# Patient Record
Sex: Female | Born: 1965 | Race: White | Hispanic: No | Smoking: Never smoker
Health system: Southern US, Community
[De-identification: ages and names within clinical notes are randomized; demographics above are authoritative.]

## PROBLEM LIST (undated history)

## (undated) DIAGNOSIS — M329 Systemic lupus erythematosus, unspecified: Secondary | ICD-10-CM

## (undated) DIAGNOSIS — Z8674 Personal history of sudden cardiac arrest: Secondary | ICD-10-CM

## (undated) DIAGNOSIS — I82409 Acute embolism and thrombosis of unspecified deep veins of unspecified lower extremity: Secondary | ICD-10-CM

---

## 1997-11-16 ENCOUNTER — Emergency Department (HOSPITAL_COMMUNITY): Admission: EM | Admit: 1997-11-16 | Discharge: 1997-11-16 | Payer: Self-pay | Admitting: Emergency Medicine

## 1998-04-22 ENCOUNTER — Ambulatory Visit (HOSPITAL_COMMUNITY): Admission: RE | Admit: 1998-04-22 | Discharge: 1998-04-22 | Payer: Self-pay | Admitting: Family Medicine

## 1998-04-23 ENCOUNTER — Inpatient Hospital Stay (HOSPITAL_COMMUNITY): Admission: AD | Admit: 1998-04-23 | Discharge: 1998-04-23 | Payer: Self-pay | Admitting: Rheumatology

## 1998-05-14 ENCOUNTER — Ambulatory Visit (HOSPITAL_COMMUNITY): Admission: RE | Admit: 1998-05-14 | Discharge: 1998-05-14 | Payer: Self-pay | Admitting: Rheumatology

## 1998-07-21 ENCOUNTER — Other Ambulatory Visit: Admission: RE | Admit: 1998-07-21 | Discharge: 1998-07-21 | Payer: Self-pay | Admitting: Obstetrics and Gynecology

## 1998-08-30 ENCOUNTER — Emergency Department (HOSPITAL_COMMUNITY): Admission: EM | Admit: 1998-08-30 | Discharge: 1998-08-30 | Payer: Self-pay | Admitting: Emergency Medicine

## 1999-08-12 ENCOUNTER — Other Ambulatory Visit: Admission: RE | Admit: 1999-08-12 | Discharge: 1999-08-12 | Payer: Self-pay | Admitting: Obstetrics and Gynecology

## 1999-08-27 ENCOUNTER — Ambulatory Visit (HOSPITAL_COMMUNITY): Admission: RE | Admit: 1999-08-27 | Discharge: 1999-08-27 | Payer: Self-pay | Admitting: Obstetrics and Gynecology

## 1999-08-27 ENCOUNTER — Encounter: Payer: Self-pay | Admitting: Obstetrics and Gynecology

## 2000-08-15 ENCOUNTER — Other Ambulatory Visit: Admission: RE | Admit: 2000-08-15 | Discharge: 2000-08-15 | Payer: Self-pay | Admitting: Obstetrics and Gynecology

## 2001-08-29 ENCOUNTER — Other Ambulatory Visit: Admission: RE | Admit: 2001-08-29 | Discharge: 2001-08-29 | Payer: Self-pay | Admitting: Obstetrics and Gynecology

## 2002-08-09 ENCOUNTER — Encounter: Payer: Self-pay | Admitting: Rheumatology

## 2002-08-09 ENCOUNTER — Encounter: Admission: RE | Admit: 2002-08-09 | Discharge: 2002-08-09 | Payer: Self-pay | Admitting: Rheumatology

## 2002-12-17 ENCOUNTER — Other Ambulatory Visit: Admission: RE | Admit: 2002-12-17 | Discharge: 2002-12-17 | Payer: Self-pay | Admitting: Obstetrics and Gynecology

## 2004-09-23 ENCOUNTER — Encounter: Admission: RE | Admit: 2004-09-23 | Discharge: 2004-09-23 | Payer: Self-pay | Admitting: Obstetrics and Gynecology

## 2005-02-21 ENCOUNTER — Ambulatory Visit (HOSPITAL_COMMUNITY): Admission: RE | Admit: 2005-02-21 | Discharge: 2005-02-22 | Payer: Self-pay | Admitting: Neurosurgery

## 2005-03-12 ENCOUNTER — Observation Stay (HOSPITAL_COMMUNITY): Admission: EM | Admit: 2005-03-12 | Discharge: 2005-03-13 | Payer: Self-pay | Admitting: Emergency Medicine

## 2005-03-15 ENCOUNTER — Inpatient Hospital Stay (HOSPITAL_COMMUNITY): Admission: EM | Admit: 2005-03-15 | Discharge: 2005-03-17 | Payer: Self-pay | Admitting: Emergency Medicine

## 2005-09-10 ENCOUNTER — Emergency Department (HOSPITAL_COMMUNITY): Admission: EM | Admit: 2005-09-10 | Discharge: 2005-09-10 | Payer: Self-pay | Admitting: Emergency Medicine

## 2005-10-27 ENCOUNTER — Other Ambulatory Visit: Admission: RE | Admit: 2005-10-27 | Discharge: 2005-10-27 | Payer: Self-pay | Admitting: Gynecology

## 2006-12-04 ENCOUNTER — Ambulatory Visit (HOSPITAL_COMMUNITY): Admission: RE | Admit: 2006-12-04 | Discharge: 2006-12-04 | Payer: Self-pay | Admitting: General Surgery

## 2006-12-04 ENCOUNTER — Encounter (INDEPENDENT_AMBULATORY_CARE_PROVIDER_SITE_OTHER): Payer: Self-pay | Admitting: General Surgery

## 2009-04-06 ENCOUNTER — Encounter: Admission: RE | Admit: 2009-04-06 | Discharge: 2009-04-06 | Payer: Self-pay | Admitting: Gynecology

## 2010-05-08 ENCOUNTER — Encounter: Payer: Self-pay | Admitting: Rheumatology

## 2010-08-31 NOTE — Op Note (Signed)
Carla Mack, Carla Mack               ACCOUNT NO.:  [id number]   MEDICAL RECORD NO.:  [id number]          PATIENT TYPE:  AMB   LOCATION:  DAY                          FACILITY:  [facility]   PHYSICIAN:  Timothy E. Earlene Plater, M.D. DATE OF BIRTH:  August 06, 1965   DATE OF PROCEDURE:  12/04/2006  DATE OF DISCHARGE:                               OPERATIVE REPORT   PREOPERATIVE DIAGNOSIS:  External hemorrhoids.   POSTOPERATIVE DIAGNOSIS:  External hemorrhoids.   PROCEDURE:  Hemorrhoidectomy.   SURGEON:  Timothy E. Earlene Plater, M.D.   ANESTHESIA:  General.   Ms. Carla Mack is 67.  Has long had external hemorrhoidal tags causing  considerable irritation, and she wishes, after very careful discussion  regarding the surgery, the expectations of the recovery, to proceed with  excisional surgery.  Again this morning, we discussed in detail the  procedure and the recovery.   She was taken to the operating room and placed supine.  LMA anesthesia  provided.  She was placed in lithotomy.  Careful inspection with  OptiVisor magnification revealed a peculiar change on the skin surface  with many tiny nodules particularly posteriorly.  These did not have the  exact appearance of condylomata but were not typical hemorrhoids.  These  were 1 or 1.5 mm.  The skin was abraded at the anal orifice.  There were  4 large external tags and several minor external tags as well.  So, the  perianal area was injected round about with Marcaine, epinephrine, and  Wydase.  This was massaged as well.  The 4 large tags were removed  first, the edges smoothed, and the wounds closed with 3-0 and 4-0  chromic.  Then the other 4 minor tags were excised.  Some undermining  was accomplished.  Therefore, I had 4 large tags, 4 small tags, and 3  hemorrhoidal varices.  I counted these 11 and placed them in formalin  myself for pathology.  I think special attention to the squamous surface  is in order.  So each wound was carefully closed.  The tiny  nodular  lesions posteriorly were cauterized only.  The orifice was well-open,  the sphincter was unharmed, and the procedure was complete.  Gelfoam and  dry sterile dressing applied.   She tolerated it, counts correct, and she was removed to the recovery  room in good condition.  Written and verbal instructions given including  Vicodin.  She will be seen and followed as an outpatient.      Timothy E. Earlene Plater, M.D.  Electronically Signed     TED/MEDQ  D:  12/04/2006  T:  12/04/2006  Job:  [id number]   cc:   Gretta Cool, M.D.  Fax: [phone]   Demetria Pore. Coral Spikes, M.D.  Fax: [phone]

## 2010-09-03 NOTE — H&P (Signed)
Carla Mack, HINNENKAMP               ACCOUNT NO.:  [id number]   MEDICAL RECORD NO.:  [id number]          PATIENT TYPE:  EMS   LOCATION:  MAJO                         FACILITY:  [facility]   PHYSICIAN:  Jackie Plum, M.D.DATE OF BIRTH:  01/06/1966   DATE OF ADMISSION:  03/15/2005  DATE OF DISCHARGE:                                HISTORY & PHYSICAL   CHIEF COMPLAINT:  Chest pain.   HISTORY OF PRESENT ILLNESS:  The patient presented with chest pain this  morning.  She had been operated on for back surgery last week and had been  discharged  from the hospital after readmission for DVT.  She came to the ED  for chest pain, which was said to be sharp, left-sided, radiating to her  back.  Pain was also noted to be severe with a scale of 7-8/10.  She has had  some shortness of breath with the pain, and the pain is worsened by deep  breathing.  At the emergency room the patient had a CT scan of the chest to  rule out PE on account of chest pain with recent diagnosis of DVT (the  patient had been taking Lovenox and Coumadin at home).  The CT scan was said  to be negative, and it showed just atelectasis.  I do not have official  report.  This is per ED physicians.  Whilst in the ED, the patient was  having a lot of pain and she was given morphine, and she started having some  difficulty breathing.  She became diaphoretic with the feeling of her throat  closing up and swelling.  She was treated emergently for presumed  anaphylactic reaction with Pepcid, Phenergan and epinephrine with  significant improvement.  The hospitalist service was called to evaluate for  admission for observation ___________.  At the time of my evaluation, the  patient's chest pain had resolved.  She was feeling fine and did not have  any shortness of breath, dizziness or any other pulmonary symptoms.  She,  however, continued to have pain on deep breathing.   PAST MEDICAL HISTORY:  1.  Positive for history of systemic  lupus erythematosus.  2.  She has a history of bilateral DVT and PE in 1990.  3.  She has history of arthritis and rash with her lupus.  4.  She is status post lumbar diskectomy and cesarean section as noted      above.   The patient's medicines are aspirin, p.r.n. ibuprofen, as well as Lovenox  and Coumadin.   Social history and family history are as noted per H&P by Hal T. Pete Glatter,  M.D., dated March 12, 2005.  __________ is reviewed.   REVIEW OF SYSTEMS:  As noted in the HPI, otherwise unremarkable.   PHYSICAL EXAMINATION:  VITAL SIGNS:  Temperature 97.4 degrees Fahrenheit, BP  98/56, pulse 53, respirations 16, O2 saturation 100% on oxygen by nasal  cannula.  GENERAL: The patient was comfortable, not in distress.  HEENT:  Pupils equal, reactive to light.  Extraocular movements intact.  Oropharynx moist.  NECK:  Supple, no JVD.  LUNGS:  Clear to auscultation.  CARDIAC:  Regular rate and rhythm, no gallops or murmur.  ABDOMEN:  Soft, nontender.  EXTREMITIES:  No cyanosis.  CENTRAL NERVOUS SYSTEM:  Nonfocal.  CHEST:  The patient did not have any chest wall tenderness by palpation.   LABORATORY DATA:  CT scan of her chest as noted above.  ABG was noted.  WBC  count 3.9, hemoglobin 14.3, hematocrit 42, platelet count 176.  Sodium 138,  potassium 3.1, chloride 107, glucose 156, BUN 22, creatinine 1.1.   IMPRESSION:  1.  Chest pain.  The patient indicates this has been ___________      postoperatively.  This involved the use of her upper extremities as the      patient her arms, and this likely represents musculoskeletal pain.  2.  Anaphylactic reaction to MORPHINE.   The patient will be admitted for observation overnight.  She will be started  on two days of both H1 and A2 blockers and she is said to have had  ventricular tachycardia in the ED, and we will just obtain a 2-D  echocardiogram for completion of her workup.  I do not think she needs any  stress test or any  further cardiac workup at this point.  The patient does  not have any history of diabetes, hypertension, dyslipidemia.  Her family  history does not include any history of diabetes, hypertension, stroke or  heart disease.  Her father died from bowel cancer, and her mother died from  cervical cancer.      Jackie Plum, M.D.  Electronically Signed     GO/MEDQ  D:  03/15/2005  T:  03/15/2005  Job:  [id number]

## 2010-09-03 NOTE — Op Note (Signed)
Carla Mack, Carla Mack               ACCOUNT NO.:  [id number]   MEDICAL RECORD NO.:  [id number]          PATIENT TYPE:  AMB   LOCATION:  SDS                          FACILITY:  [facility]   PHYSICIAN:  Henry A. Pool, M.D.    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   OPERATION PERFORMED:  Right L3-4 laminotomy and microdiskectomy.   SURGEON:  Kathaleen Maser. Pool, M.D.   ASSISTANT:  Reinaldo Meeker, M.D.   ANESTHESIA:  General endotracheal.   INDICATIONS FOR PROCEDURE:  Ms. 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  The patient has had severe right lower extremity radicular pain  consistent with a right-sided, L3 versus L4 radiculopathy.  The patient has  failed conservative management.  She has been so miserable with pain that  she has been unable to be imaged adequately because she cannot hold still  for a scan.  She presents to the hospital today for MRI scanning under hoped  conscious sedation followed by probable lumbar microdiskectomy.  The patient  was unable to hold still for the MRI scan even with high doses of conscious  sedation. The patient was then converted to a general anesthetic in order to  obtain the MRI scan.  MRI scanning did confirm the right-sided L3-4 disk  herniation with compression of the right-sided L3 nerve root from a superior  free fragment.  This is what the initial working diagnosis had been and the  patient had already signed a consent for the planned surgery.  I discussed  the situation with her husband and he decided to proceed directly back to  the operating room for microdiskectomy in hopes of improving her  symptoms.   DESCRIPTION OF PROCEDURE:  The patient was taken to the operating room and  placed on the table in the supine position.  After adequate level of  anesthesia was achieved, the patient was positioned prone onto a Wilson  frame and appropriately padded.  The patient's lumbar region was prepped and  draped sterilely.  A 10 blade was used to make a linear skin incision  overlying the L3-4 interspace.  This was carried down sharply in the  midline.  A subperiosteal dissection was then performed exposing the lamina  and facet joints of L3 and L4 on the right side.  Deep self-retaining  retractor was placed.  Intraoperative x-ray was taken, the level was  confirmed.  The laminotomy was then performed using high speed drill and  Kerrison rongeurs to remove the inferior edge of the lamina at L3, medial  aspect of the L3-4 facet joint and superior rim of the L4 lamina.  The  ligamentum flavum was then elevated and resected in piecemeal fashion using  Kerrison rongeurs.  The underlying thecal sac and  exiting L4 nerve root were  identified.   The microscope was brought into the field and used for microdissection of  the spinal canal and disk herniation.  Epidural venous plexus was coagulated  and cut.  A superior free fragment of disk herniation was encountered in the  axilla of the right-sided L3 nerve root.  This was dissected free and  removed using pituitary microrongeurs.  This completely decompressed the  left-sided L3 nerve root which then came into view.  The canal was inspected  for further disk herniation and none was found. The disk space itself was  quite flat.  The annulus was essentially intact.  The disk space was  palpated and found no obvious major rent worrisome for reherniation.  For  that reason an annulotomy and further diskectomy was not performed.  The  wound was then irrigated with antibiotic solution.  Gelfoam was placed  topically for hemostasis which was found  to be good.  Microscope and  retractor system were removed.  Hemostasis in muscle was achieved with  electrocautery.  The wound was then closed in layers with Vicryl sutures.  Steri-Strips and sterile dressing were applied.  There were no apparent  complications.  The patient tolerated the procedure well and returned to the  recovery room postoperatively.           ______________________________  Kathaleen Maser Pool, M.D.     HAP/MEDQ  D:  02/21/2005  T:  02/22/2005  Job:  [id number]

## 2010-09-03 NOTE — Discharge Summary (Signed)
Carla Mack, Carla Mack               ACCOUNT NO.:  [id number]   MEDICAL RECORD NO.:  [id number]          PATIENT TYPE:  INP   LOCATION:  3706                         FACILITY:  [facility]   PHYSICIAN:  Sherin Quarry, MD      DATE OF BIRTH:  16-Nov-1965   DATE OF ADMISSION:  03/15/2005  DATE OF DISCHARGE:  03/17/2005                                 DISCHARGE SUMMARY   Carla Mack is a 45 year old lady who recently was discharged from the  hospital after being and diagnosed with a deep vein thrombophlebitis which  occurred following a lumbar diskectomy.  She was discharged on a regimen of  Lovenox 80 mg q.12h. and Coumadin with the plan that she would remain on the  Lovenox until her INR became therapeutic.  On March 15, 2005, she  presented to the [facility] Emergency Room with  complaints of chest discomfort.  It was described as sharp, left-sided,  radiating through the back and being about 7 to 8/10.  It was associated  with a feeling of shortness of breath.  In light of her history, a CT scan  of the chest was performed which showed no signs of a pulmonary embolus.  While in the emergency room, the patient was administered morphine and  shortly after receiving the intravenous morphine, she became diaphoretic and  experienced a feeling of throat swelling and generalized itching.  Concern  was raised that she might be having anaphylactic reaction to the morphine.  She was given the Pepcid, epinephrine and Phenergan and her overall  condition improved.  However, it was felt prudent to admit her to the  hospital for monitoring in light of this reaction.   Physical exam at time of admission as described by Dr. Julio Sicks,  the  patient was comfortable and not any distress of the time that Dr. Julio Sicks  saw her. HEENT exam was within normal limits. The chest was clear.  Cardiovascular exam revealed normal S1 and S2 without rubs, murmurs or  gallops. The abdomen was  benign. Neurologic testing showed no abnormalities  of  motor, sensory or cerebellar system.  Examination of extremities showed  no signs of cyanosis or edema.   Relevant laboratory studies obtained included a prothrombin time which  showed an INR 1.1.  Hemoglobin was 11.8.  The potassium level was initially  3.1.  In light of the patient's surprisingly low INR, she was maintained on  Lovenox 80 mg subcutaneously every 12 hours and was placed on Coumadin 7.5  mg daily.  By March 17, 2005, her INR was up to 1.5.  At that point her  potassium was up to 4.4 after she received oral potassium replacement. She  was feeling well at that time and plan was made for discharge.   DISCHARGE DIAGNOSES:  1.  Transient chest pain not secondary to pulmonary embolus consider      gastrointestinal cause.  2.  Anaphylactic reaction to morphine.  3.  Deep vein thrombophlebitis.  4.  History of systemic lupus erythematosus.  5.  Status post recent lumbar diskectomy.  DISCHARGE MEDICATIONS:  1.  Coumadin. The patient will be advised to take 7.5 mg of Coumadin      Thursday and Friday, 5 mg Saturday and Sunday and then have her      prothrombin time rechecked in the office on Monday. Apparently she is      going to do this in Dr. Yevonne Pax office.  2.  She is advised to continue Lovenox 80 mg subcutaneously every 2 hours      until she receives further instructions.  3.  Because she is having indigestion symptoms, she is advised to stop      ibuprofen.  4.  She has some Vicodin at home which she can take p.r.n. for pain.  5.  She was also advised to take Pepcid AC one tablet b.i.d.   I emphasized to her that it was extremely important that she have her  prothrombin time measured on Monday and then receive further instructions at  that time.  She indicated that she did not have any Coumadin at home and I  therefore wrote her a prescription for this.   CONDITION AT TIME OF DISCHARGE:  Good.            ______________________________  Sherin Quarry, MD     SY/MEDQ  D:  03/17/2005  T:  03/17/2005  Job:  [id number]   cc:   Donia Guiles, M.D.  Fax: [phone]   Demetria Pore. Coral Spikes, M.D.  Fax: [phone]   Cassell Clement, M.D.  Fax: [phone]

## 2010-09-03 NOTE — Discharge Summary (Signed)
Carla Mack, Carla Mack               ACCOUNT NO.:  [id number]   MEDICAL RECORD NO.:  [id number]          PATIENT TYPE:  INP   LOCATION:  1521                         FACILITY:  [facility]   PHYSICIAN:  Candyce Churn, M.D.DATE OF BIRTH:  19-Jun-1965   DATE OF ADMISSION:  03/12/2005  DATE OF DISCHARGE:  03/13/2005                                 DISCHARGE SUMMARY   DISCHARGE DIAGNOSES:  1.  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 Left lower extremity venous Doppler ultrasound pending this      a.m.  2.  Systemic lupus erythematous with arthritis - followed by Dr. Lennox Pippins.   HOSPITAL COURSE:  The patient was admitted on March 12, 2005 with left  leg swelling for 2 days. She had had a lumbar diskectomy Dr. Julio Sicks  approximately 3 weeks ago. She has a left calf that is painful when she  walks and on examination by Dr. __________ on March 12, 2005 her left  calf was swollen with tenderness posteriorly.   Her PTT is elevated at 58 suggesting possible lupus anticoagulant.   The patient was started on subcu Lovenox and her leg feels better today. She  has had no chest pain or shortness of breath.   Her calculated dose of Lovenox is 80 mg subcu q.12 hours for DVT and she  will get a dose at 11 a.m. and pending results of venous Doppler discharge  will be likely early this afternoon.   If DVT is confirmed, she will be discharged on the following:  1.  Lovenox 80 mg subcu q.12 h.  2.  Coumadin 10 mg p.o. x1 today and then 5 mg daily with followup PT/INR on      March 15, 2005.   She normally takes an aspirin daily and we will keep her off aspirin for now  and defer decision on aspirin therapy to Dr. Lennox Pippins.   DISCHARGE LABORATORIES:  From March 12, 2005, white count 5200,  hemoglobin 12.2, platelet count 136,000, normal differential. PT is 14.1  seconds, PTT is 58 seconds - elevated. Sodium 138, potassium  4.4, chloride  100, bicarb 28, glucose 96, BUN 20, creatinine 1.2, LFTs within normal  limits except for a slightly low albumin at 3.3. Calcium 9.2.      Candyce Churn, M.D.  Electronically Signed     RNG/MEDQ  D:  03/13/2005  T:  03/14/2005  Job:  [id number]   cc:   Demetria Pore. Coral Spikes, M.D.  Fax: [phone]   Kathaleen Maser. Pool, M.D.  Fax: [phone]

## 2010-09-03 NOTE — H&P (Signed)
[name], Mack               ACCOUNT NO.:  [id number]   MEDICAL RECORD NO.:  [id number]          PATIENT TYPE:  EMS   LOCATION:  ED                           FACILITY:  [facility]   PHYSICIAN:  Hal T. Stoneking, M.D. DATE OF BIRTH:  October 06, 1965   DATE OF ADMISSION:  03/12/2005  DATE OF DISCHARGE:                                HISTORY & PHYSICAL   IDENTIFYING DATA:  Carla Mack is a very nice, 45 year old white female,  generally in good health, followed by Dr. Coral Spikes. She does have a  background history of bilateral DVT and pulmonary embolus in 1990 when she  gave birth by C-section to her daughter. 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 She developed a lumbar disk, is status post a lumbar  diskectomy 3 weeks ago by Dr. Jordan Likes. She started physical therapy about a  week ago. Two days ago, while walking, she noted pain in her left calf. She  noted that her left calf was swollen and would not go down, so she came to  the emergency room for evaluation. She denied any cough or chest pain.   ALLERGIES:  No known drug allergies.   CURRENT MEDICATIONS:  1.  Aspirin 81 mg a day.  2.  She takes p.r.n. Pamprin during her periods.  3.  Ibuprofen p.r.n.   PAST MEDICAL HISTORY:  1.  Remarkable for bilateral DVT and pulmonary embolus in 1990.  2.  History of lupus primarily with arthritis, although she has had a rash      in the past.   PAST SURGICAL HISTORY:  1.  She is status post C-section.  2.  Status post lumbar diskectomy.   SOCIAL HISTORY:  She is married and has one daughter of her own, age 47, and  also a step-daughter. She is a Theatre stage manager. She stopped smoking in 1990.  Does not consume alcohol.   FAMILY HISTORY:  Mother died at age 67 of cervical cancer. Father died at  age 62 of colon cancer. She has a sister with  diabetes.   REVIEW OF SYSTEMS:  No change in her vision. She does complain of a dull  headache. No trouble chewing or swallowing, although she has complained of  some heartburn. No abdominal pain. No urinary or bowel complaints.   PHYSICAL EXAMINATION:  VITAL SIGNS:  Temperature 97.2, blood pressure  126/83, pulse 89, O2 saturation 99.  SKIN:  Normal.  HEENT:  Pupils are equal, round, and reactive to light. Disks sharp.  Vasculature normal. Tympanic membranes normal. Oropharynx moist.  LUNGS:  Clear.  HEART:  Regular rate and rhythm. Without murmurs, rubs, or gallops.  ABDOMEN:  Soft. No hepatosplenomegaly or masses palpated.  EXTREMITIES:  Examination revealed positive Denna Haggard' sign on the left. Left  calf is definitely swollen and tender posteriorly.   LABORATORY DATA:  She had a CBC that revealed a white count of 5200,  hemoglobin 12.2, platelet count 136,000 with 68% neutrophils, 22% lymphs, 6%  monocytes, 4% eosinophils. Sodium 138, potassium 4.4, chloride 100, bicarb  28, glucose 96, BUN 20, creatinine 1.2, calcium 9.2, total protein 6.9,  albumin 3.3, SGOT 17, SGPT 11, alkaline phosphatase 58, total bilirubin 0.7.   ASSESSMENT:  High risk for deep vein thrombosis with her prior history of  DVT and pulmonary embolus. Especially with her recent back surgery and  current findings on physical examination.   PLAN:  Will admit. Will begin subcutaneous Lovenox. Will obtain Doppler  study tomorrow. There is a possibility that she might be able to go home  with home Lovenox and Coumadin, depending on insurance coverage.           ______________________________  Sunday Spillers Pete Glatter, M.D.     HTS/MEDQ  D:  03/12/2005  T:  03/12/2005  Job:  [id number]   cc:   Demetria Pore. Coral Spikes, M.D.  Fax: [phone]

## 2010-09-22 ENCOUNTER — Other Ambulatory Visit: Payer: Self-pay | Admitting: Gynecology

## 2011-01-28 LAB — COMPREHENSIVE METABOLIC PANEL
ALT: 21
AST: 27
Albumin: 3.6
Alkaline Phosphatase: 41
BUN: 25 — ABNORMAL HIGH
CO2: 29
Calcium: 9.1
Chloride: 106
Creatinine, Ser: 1.64 — ABNORMAL HIGH
GFR calc Af Amer: 42 — ABNORMAL LOW
GFR calc non Af Amer: 34 — ABNORMAL LOW
Glucose, Bld: 99
Potassium: 4.5
Sodium: 142
Total Bilirubin: 0.9
Total Protein: 6.7

## 2011-01-28 LAB — CBC
HCT: 39.7
Hemoglobin: 14.1
MCHC: 35.5
MCV: 88.1
Platelets: 189
RBC: 4.51
RDW: 13.2
WBC: 3.2 — ABNORMAL LOW

## 2011-01-28 LAB — DIFFERENTIAL
Basophils Absolute: 0
Basophils Relative: 1
Eosinophils Absolute: 0
Eosinophils Relative: 2
Lymphocytes Relative: 33
Lymphs Abs: 1
Monocytes Absolute: 0.3
Monocytes Relative: 9
Neutro Abs: 1.8
Neutrophils Relative %: 57

## 2011-01-28 LAB — PREGNANCY, URINE: Preg Test, Ur: NEGATIVE

## 2011-07-07 ENCOUNTER — Encounter: Payer: Self-pay | Admitting: *Deleted

## 2012-02-10 ENCOUNTER — Emergency Department (HOSPITAL_COMMUNITY): Payer: Worker's Compensation

## 2012-02-10 ENCOUNTER — Emergency Department (HOSPITAL_COMMUNITY)
Admission: EM | Admit: 2012-02-10 | Discharge: 2012-02-10 | Disposition: A | Discharge status: Home / Self Care | Payer: Worker's Compensation | Attending: Emergency Medicine | Admitting: Emergency Medicine

## 2012-02-10 ENCOUNTER — Encounter (HOSPITAL_COMMUNITY): Payer: Self-pay | Admitting: Adult Health

## 2012-02-10 DIAGNOSIS — Z8674 Personal history of sudden cardiac arrest: Secondary | ICD-10-CM | POA: Insufficient documentation

## 2012-02-10 DIAGNOSIS — Y990 Civilian activity done for income or pay: Secondary | ICD-10-CM | POA: Insufficient documentation

## 2012-02-10 DIAGNOSIS — T148XXA Other injury of unspecified body region, initial encounter: Secondary | ICD-10-CM

## 2012-02-10 DIAGNOSIS — W1809XA Striking against other object with subsequent fall, initial encounter: Secondary | ICD-10-CM | POA: Insufficient documentation

## 2012-02-10 DIAGNOSIS — S20229A Contusion of unspecified back wall of thorax, initial encounter: Secondary | ICD-10-CM

## 2012-02-10 DIAGNOSIS — Y929 Unspecified place or not applicable: Secondary | ICD-10-CM | POA: Insufficient documentation

## 2012-02-10 DIAGNOSIS — I82409 Acute embolism and thrombosis of unspecified deep veins of unspecified lower extremity: Secondary | ICD-10-CM | POA: Insufficient documentation

## 2012-02-10 DIAGNOSIS — M329 Systemic lupus erythematosus, unspecified: Secondary | ICD-10-CM | POA: Insufficient documentation

## 2012-02-10 DIAGNOSIS — IMO0002 Reserved for concepts with insufficient information to code with codable children: Secondary | ICD-10-CM | POA: Insufficient documentation

## 2012-02-10 HISTORY — DX: Systemic lupus erythematosus, unspecified: M32.9

## 2012-02-10 HISTORY — DX: Personal history of sudden cardiac arrest: Z86.74

## 2012-02-10 HISTORY — DX: Acute embolism and thrombosis of unspecified deep veins of unspecified lower extremity: I82.409

## 2012-02-10 MED ORDER — CYCLOBENZAPRINE HCL 10 MG PO TABS: 10.0000 mg | ORAL_TABLET | Freq: Two times a day (BID) | ORAL | Status: AC | PRN

## 2012-02-10 MED ORDER — IBUPROFEN 800 MG PO TABS: 800.0000 mg | ORAL_TABLET | Freq: Three times a day (TID) | ORAL | Status: AC

## 2012-02-10 NOTE — ED Notes (Signed)
While at work slipped and fell on pile of debris and landed on airpack on back. Pain is located in lumbar and mid thoracic are. Pt took aleve and ibuprofen with no relief. Pain rating 7/10. Denies numbness and tingling to toes and legs. Able to ambulate. Pain worse with movement.

## 2012-02-10 NOTE — ED Provider Notes (Signed)
History     CSN: [id number]  Arrival date & time 02/10/12  2056   First MD Initiated Contact with Patient 02/10/12 2146      Chief Complaint  Patient presents with  . Back Pain    (Consider location/radiation/quality/duration/timing/severity/associated sxs/prior treatment) HPI  Patient presents to the emergency department for evaluation of her back. 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 Air pack weighs 70 pounds. She says that she is able to walk and has been taking Aleve and ibuprofen with mild relief. She denies have any lacerations or bruising. The incident happened earlier today. She denies any numbness or tingling in her toes her legs. She is ambulatory but her pain gets much worse with movement. She denies loss of bowel or urine control. She is in no acute distress at this time and her vital signs are stable.  Past Medical History  Diagnosis Date  . Lupus   . DVT (deep venous thrombosis)   . History of cardiac arrest     Past Surgical History  Procedure Date  . Cesarean section     History reviewed. No pertinent family history.  History  Substance Use Topics  . Smoking status: Never Smoker   . Smokeless tobacco: Not on file  . Alcohol Use: Yes    OB History    Grav Para Term Preterm Abortions TAB SAB Ect Mult Living                  Review of Systems   Review of Systems  Gen: no weight loss, fevers, chills, night sweats  Eyes: no discharge or drainage, no occular pain or visual changes  Nose: no epistaxis or rhinorrhea  Mouth: no dental pain, no sore throat  Neck: no neck pain  Lungs:No wheezing, coughing or hemoptysis CV: no chest pain, palpitations, dependent edema or orthopnea  Abd: no abdominal pain, nausea, vomiting  GU: no dysuria or gross hematuria  MSK:  Back injury Neuro: no headache, no focal neurologic deficits  Skin: no  abnormalities Psyche: negative.    Allergies  Morphine and related and Promethazine hcl  Home Medications   Current Outpatient Rx  Name Route Sig Dispense Refill  . ASPIRIN 325 MG PO TABS Oral Take 325 mg by mouth daily.    Marland Kitchen CALCIUM CITRATE-VITAMIN D 315-200 MG-UNIT PO TABS Oral Take 1 tablet by mouth 2 (two) times daily.    Marland Kitchen VITAMIN D 1000 UNITS PO TABS Oral Take 1,000 Units by mouth daily.    . CO Q 10 PO Oral Take 1 capsule by mouth daily.     . OMEGA-3 FATTY ACIDS 1000 MG PO CAPS Oral Take 2 g by mouth daily.    Marland Kitchen LYSINE PO Oral Take 1 tablet by mouth daily.     . MG-PLUS PROTEIN 133 MG PO TABS Oral Take 1 tablet by mouth 2 (two) times daily.    Marland Kitchen VITAMIN C 500 MG PO TABS Oral Take 500 mg by mouth daily.    . CYCLOBENZAPRINE HCL 10 MG PO TABS Oral Take 1 tablet (10 mg total) by mouth 2 (two) times daily as needed for muscle spasms. 20 tablet 0  . IBUPROFEN 800 MG PO TABS Oral Take 1 tablet (800 mg total) by mouth 3 (three) times daily. 21 tablet 0    BP 131/86  Pulse 78  Temp  98.5 F (36.9 C) (Oral)  Resp 16  SpO2 96%  Physical Exam  Nursing note and vitals reviewed. Constitutional: She appears well-developed and well-nourished. No distress.  HENT:  Head: Normocephalic and atraumatic.  Eyes: Pupils are equal, round, and reactive to light.  Neck: Normal range of motion. Neck supple.  Cardiovascular: Normal rate and regular rhythm.   Pulmonary/Chest: Effort normal.  Abdominal: Soft.  Musculoskeletal:       Back:        Equal strength to bilateral lower extremities. Neurosensory  function adequate to both legs. Skin color is normal. Skin is warm and moist. I see no step off deformity, no bony tenderness. Pt is able to ambulate without limp. Pain is relieved when sitting in certain positions. ROM is decreased due to pain. No crepitus, laceration, effusion, swelling.  Pulses are normal   Neurological: She is alert.  Skin: Skin is warm and dry.    ED Course    Procedures (including critical care time)  Labs Reviewed - No data to display Dg Thoracic Spine 2 View  02/10/2012  *RADIOLOGY REPORT*  Clinical Data: Fall, back pain  THORACIC SPINE - 2 VIEW  Comparison: CT chest dated 03/15/2005  Findings: Normal thoracic kyphosis.  No evidence of fracture or dislocation.  Vertebral body heights are maintained.  Mild multilevel degenerative changes.  Visualized lungs are essentially clear.  IMPRESSION: No fracture or dislocation is seen.  Mild multilevel degenerative changes.   Original Report Authenticated By: Charline Bills, M.D.    Dg Lumbar Spine Complete  02/10/2012  *RADIOLOGY REPORT*  Clinical Data: Fall, low back pain  LUMBAR SPINE - COMPLETE 4+ VIEW  Comparison: MRI lumbar spine dated 02/21/2005  Findings: Five lumbar-type vertebral bodies.  Normal lumbar lordosis.  No evidence of fracture or dislocation.  Vertebral body heights are maintained.  Mild tricompartmental degenerative changes.  Visualized bony pelvis is intact.  IMPRESSION: No fracture or dislocation is seen.  Mild multilevel degenerative changes.   Original Report Authenticated By: Charline Bills, M.D.      1. Back contusion   2. Muscle strain       MDM  PT put on light duty for two weeks to recover. She declines narcotics. Flexeril and 800mg  ibuprofen Rx given.  Workers comp case.  Patient with back pain. No neurological deficits. Patient is ambulatory. No warning symptoms of back pain including: loss of bowel or bladder control, night sweats, waking from sleep with back pain, unexplained fevers or weight loss, h/o cancer, IVDU, recent trauma. No concern for cauda equina, epidural abscess, or other serious cause of back pain. Conservative measures such as rest, ice/heat and pain medicine indicated with PCP follow-up if no improvement with conservative management.           Dorthula Matas, PA 02/10/12 2336

## 2012-02-10 NOTE — ED Notes (Signed)
Pt having pain in lower mid  And rt back pain.  Pt able to ambulate.

## 2012-02-13 NOTE — ED Provider Notes (Signed)
Medical screening examination/treatment/procedure(s) were performed by non-physician practitioner and as supervising physician I was immediately available for consultation/collaboration.  Donnetta Hutching, MD 02/13/12 [phone]

## 2016-03-29 ENCOUNTER — Emergency Department (HOSPITAL_COMMUNITY)
Admission: EM | Admit: 2016-03-29 | Discharge: 2016-03-29 | Disposition: A | Discharge status: Home / Self Care | Payer: Worker's Compensation | Attending: Emergency Medicine | Admitting: Emergency Medicine

## 2016-03-29 ENCOUNTER — Encounter (HOSPITAL_COMMUNITY): Payer: Self-pay | Admitting: *Deleted

## 2016-03-29 DIAGNOSIS — X500XXA Overexertion from strenuous movement or load, initial encounter: Secondary | ICD-10-CM | POA: Diagnosis not present

## 2016-03-29 DIAGNOSIS — Y929 Unspecified place or not applicable: Secondary | ICD-10-CM | POA: Diagnosis not present

## 2016-03-29 DIAGNOSIS — Y939 Activity, unspecified: Secondary | ICD-10-CM | POA: Diagnosis not present

## 2016-03-29 DIAGNOSIS — Z7982 Long term (current) use of aspirin: Secondary | ICD-10-CM | POA: Insufficient documentation

## 2016-03-29 DIAGNOSIS — S39012A Strain of muscle, fascia and tendon of lower back, initial encounter: Secondary | ICD-10-CM | POA: Diagnosis not present

## 2016-03-29 DIAGNOSIS — Z79899 Other long term (current) drug therapy: Secondary | ICD-10-CM | POA: Diagnosis not present

## 2016-03-29 DIAGNOSIS — Y990 Civilian activity done for income or pay: Secondary | ICD-10-CM | POA: Diagnosis not present

## 2016-03-29 DIAGNOSIS — S3992XA Unspecified injury of lower back, initial encounter: Secondary | ICD-10-CM | POA: Diagnosis present

## 2016-03-29 MED ORDER — LORAZEPAM 2 MG/ML IJ SOLN: 1.0000 mg | Freq: Once | INTRAMUSCULAR | Status: DC

## 2016-03-29 MED ORDER — LORAZEPAM 2 MG/ML IJ SOLN
1.0000 mg | Freq: Once | INTRAMUSCULAR | Status: AC
Start: 2016-03-29 — End: 2016-03-29
  Administered 2016-03-29: 1 mg via INTRAMUSCULAR
  Filled 2016-03-29: qty 1

## 2016-03-29 MED ORDER — ETODOLAC 500 MG PO TABS: 500.0000 mg | ORAL_TABLET | Freq: Two times a day (BID) | ORAL | 0 refills | Status: AC

## 2016-03-29 MED ORDER — DIAZEPAM 5 MG PO TABS: 5.0000 mg | ORAL_TABLET | Freq: Three times a day (TID) | ORAL | 0 refills | Status: AC | PRN

## 2016-03-29 NOTE — ED Provider Notes (Signed)
[facility]-EMERGENCY DEPT Provider Note   CSN: [id number] Arrival date & time: 03/29/16  1222  By signing my name below, I, Majel Homer, attest that this documentation has been prepared under the direction and in the presence of Linwood Dibbles, MD . Electronically Signed: Majel Homer, Scribe. 03/29/2016. 12:56 PM.  History   Chief Complaint Chief Complaint  Patient presents with  . Back Pain   The history is provided by the patient. No language interpreter was used.   HPI Comments: Carla Mack is a 50 y.o. female who presents to the Emergency Department complaining of sudden onset, "burning," lower back pain s/p an injury that began this morning. 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 She states associated numbness in her back, nausea and dizziness that lasted for a few minutes. She states her pain is exacerbated when standing upright and with movement. She notes it intermittently radiates into her buttocks. Pt reports she was seen at [facility] earlier today and was given 60 mh Toradol with no relief. Pt notes PSHx of microdiscectomy in 2006 y Dr. Dutch Quint. She denies numbness and weakness in her extremities now in the ED.   Past Medical History:  Diagnosis Date  . DVT (deep venous thrombosis) (HCC)   . History of cardiac arrest   . Lupus    There are no active problems to display for this patient.  Past Surgical History:  Procedure Laterality Date  . CESAREAN SECTION      OB History    No data available     Home Medications    Prior to Admission medications   Medication Sig Start Date End Date Taking? Authorizing Provider  aspirin 325 MG tablet Take 325 mg by mouth daily.    Historical Provider, MD  calcium citrate-vitamin D (CITRACAL+D) 315-200 MG-UNIT per tablet Take 1 tablet by mouth 2 (two) times daily.    Historical Provider, MD  cholecalciferol (VITAMIN D) 1000  UNITS tablet Take 1,000 Units by mouth daily.    Historical Provider, MD  Coenzyme Q10 (CO Q 10 PO) Take 1 capsule by mouth daily.     Historical Provider, MD  cyclobenzaprine (FLEXERIL) 10 MG tablet Take 1 tablet (10 mg total) by mouth 2 (two) times daily as needed for muscle spasms. 02/10/12   Tiffany Neva Seat, PA-C  diazepam (VALIUM) 5 MG tablet Take 1 tablet (5 mg total) by mouth every 8 (eight) hours as needed. 03/29/16   Linwood Dibbles, MD  etodolac (LODINE) 500 MG tablet Take 1 tablet (500 mg total) by mouth 2 (two) times daily. 03/29/16   Linwood Dibbles, MD  fish oil-omega-3 fatty acids 1000 MG capsule Take 2 g by mouth daily.    Historical Provider, MD  ibuprofen (ADVIL,MOTRIN) 800 MG tablet Take 1 tablet (800 mg total) by mouth 3 (three) times daily. 02/10/12   Tiffany Neva Seat, PA-C  LYSINE PO Take 1 tablet by mouth daily.     Historical Provider, MD  Specialty Vitamins Products (MAGNESIUM, AMINO ACID CHELATE,) 133 MG tablet Take 1 tablet by mouth 2 (two) times daily.    Historical Provider, MD  vitamin C (ASCORBIC ACID) 500 MG tablet Take 500 mg by mouth daily.    Historical Provider, MD    Family History History reviewed. No pertinent family history.  Social History Social History  Substance Use Topics  . Smoking status: Never Smoker  .  Smokeless tobacco: Not on file  . Alcohol use Yes   Allergies   Morphine and related and Promethazine hcl   Review of Systems Review of Systems  Gastrointestinal: Positive for nausea (resolved).  Musculoskeletal: Positive for back pain.  Neurological: Positive for dizziness (resolved) and numbness (resolved). Negative for weakness.   Physical Exam Updated Vital Signs BP (!) 149/102 (BP Location: Right Arm)   Pulse 108   Temp 98.2 F (36.8 C) (Oral)   Resp 18   Ht 5\' 4"  (1.626 m)   Wt 145 lb (65.8 kg)   LMP 03/14/2016   SpO2 99%   BMI 24.89 kg/m   Physical Exam  Constitutional: She appears well-developed and well-nourished. No distress.    HENT:  Head: Normocephalic and atraumatic.  Right Ear: External ear normal.  Left Ear: External ear normal.  Eyes: Conjunctivae are normal. Right eye exhibits no discharge. Left eye exhibits no discharge. No scleral icterus.  Neck: Neck supple. No tracheal deviation present.  Cardiovascular: Normal rate.   Pulmonary/Chest: Effort normal. No stridor. No respiratory distress.  Abdominal: She exhibits no distension.  Musculoskeletal: She exhibits no edema.       Lumbar back: She exhibits decreased range of motion and tenderness. She exhibits no swelling and no deformity.  Pt is standing up at the bedside with her torso flexed lying on the bed.   Neurological: She is alert. Cranial nerve deficit: no gross deficits.  Normal strength and sensation in lower extremities.   Skin: Skin is warm and dry. No rash noted.  Psychiatric: She has a normal mood and affect.  Nursing note and vitals reviewed.  ED Treatments / Results     Procedures Procedures (including critical care time)  Medications Ordered in ED Medications  LORazepam (ATIVAN) injection 1 mg (1 mg Intramuscular Given 03/29/16 1340)    DIAGNOSTIC STUDIES:  Oxygen Saturation is 99% on RA, normal by my interpretation.    COORDINATION OF CARE:  12:48 PM Discussed treatment plan with pt at bedside and pt agreed to plan.  Initial Impression / Assessment and Plan / ED Course  I have reviewed the triage vital signs and the nursing notes.  Pertinent labs & imaging results that were available during my care of the patient were reviewed by me and considered in my medical decision making (see chart for details).  Clinical Course as of Mar 31 2227  Tue Mar 29, 2016  1437 Feeling better.  Would like 1 more dose of meds.  Pt wants to avoid any narcotic pain medications. Plan on dc home with nsaids and valium  [JK]    Clinical Course User Index [JK] Linwood DibblesJon Undrea Archbold, MD    Suspect acute muscle strain. Patient may have a herniated disc  that she has no acute neurologic findings. She was given Valium for muscle spasm. She wants to avoid any narcotic pain medications. She improved after treatment.  Final Clinical Impressions(s) / ED Diagnoses   Final diagnoses:  Back strain, initial encounter    New Prescriptions Discharge Medication List as of 03/29/2016  2:54 PM    START taking these medications   Details  diazepam (VALIUM) 5 MG tablet Take 1 tablet (5 mg total) by mouth every 8 (eight) hours as needed., Starting Tue 03/29/2016, Print    etodolac (LODINE) 500 MG tablet Take 1 tablet (500 mg total) by mouth 2 (two) times daily., Starting Tue 03/29/2016, Print       I personally performed the services described in this  documentation, which was scribed in my presence.  The recorded information has been reviewed and is accurate.     Linwood DibblesJon Jaquin Coy, MD 03/30/16 2229

## 2016-03-29 NOTE — ED Triage Notes (Signed)
Pt reports dong exercise and pick up object had sudden onset of lower back pain that radiated up her back. Denies any numbness/tingling/incontinence. Hx of back surgery in 2006.

## 2016-03-29 NOTE — Discharge Instructions (Signed)
Follow up with your primary care doctor or back doctor, avoid any lifting/strenuous activity, follow up with occupational health to determine when you can resume full duty

## 2016-03-29 NOTE — ED Notes (Signed)
Pt has a hx of back surgery in 2006 with Dr. Dutch QuintPoole-- has low back pain radiating to left buttock. No incontinence. 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

## 2017-06-22 ENCOUNTER — Telehealth: Payer: Self-pay | Admitting: Gastroenterology

## 2017-07-26 NOTE — Telephone Encounter (Signed)
Patient has not returned phone call. Records will be in "records reviewed" folder. °

## 2017-11-17 ENCOUNTER — Emergency Department (HOSPITAL_COMMUNITY): Payer: 59

## 2017-11-17 ENCOUNTER — Emergency Department (HOSPITAL_COMMUNITY)
Admission: EM | Admit: 2017-11-17 | Discharge: 2017-11-18 | Disposition: A | Discharge status: Home / Self Care | Payer: 59 | Attending: Emergency Medicine | Admitting: Emergency Medicine

## 2017-11-17 ENCOUNTER — Encounter (HOSPITAL_COMMUNITY): Payer: Self-pay | Admitting: Emergency Medicine

## 2017-11-17 DIAGNOSIS — M4802 Spinal stenosis, cervical region: Secondary | ICD-10-CM

## 2017-11-17 DIAGNOSIS — M9981 Other biomechanical lesions of cervical region: Secondary | ICD-10-CM | POA: Diagnosis not present

## 2017-11-17 DIAGNOSIS — Z79899 Other long term (current) drug therapy: Secondary | ICD-10-CM | POA: Diagnosis not present

## 2017-11-17 DIAGNOSIS — R202 Paresthesia of skin: Secondary | ICD-10-CM | POA: Insufficient documentation

## 2017-11-17 DIAGNOSIS — Z7982 Long term (current) use of aspirin: Secondary | ICD-10-CM | POA: Diagnosis not present

## 2017-11-17 DIAGNOSIS — M542 Cervicalgia: Secondary | ICD-10-CM | POA: Diagnosis present

## 2017-11-17 LAB — CBC
HEMATOCRIT: 41 % (ref 36.0–46.0)
Hemoglobin: 14 g/dL (ref 12.0–15.0)
MCH: 31.2 pg (ref 26.0–34.0)
MCHC: 34.1 g/dL (ref 30.0–36.0)
MCV: 91.3 fL (ref 78.0–100.0)
PLATELETS: 132 10*3/uL — AB (ref 150–400)
RBC: 4.49 MIL/uL (ref 3.87–5.11)
RDW: 13.1 % (ref 11.5–15.5)
WBC: 3.7 10*3/uL — ABNORMAL LOW (ref 4.0–10.5)

## 2017-11-17 LAB — BASIC METABOLIC PANEL
Anion gap: 10 (ref 5–15)
BUN: 25 mg/dL — AB (ref 6–20)
CHLORIDE: 105 mmol/L (ref 98–111)
CO2: 23 mmol/L (ref 22–32)
CREATININE: 1.49 mg/dL — AB (ref 0.44–1.00)
Calcium: 9.2 mg/dL (ref 8.9–10.3)
GFR calc Af Amer: 45 mL/min — ABNORMAL LOW (ref >60)
GFR calc non Af Amer: 39 mL/min — ABNORMAL LOW (ref >60)
GLUCOSE: 90 mg/dL (ref 70–99)
POTASSIUM: 4.3 mmol/L (ref 3.5–5.1)
Sodium: 138 mmol/L (ref 135–145)

## 2017-11-17 LAB — URINALYSIS, ROUTINE W REFLEX MICROSCOPIC
BILIRUBIN URINE: NEGATIVE
GLUCOSE, UA: NEGATIVE mg/dL
KETONES UR: NEGATIVE mg/dL
LEUKOCYTES UA: NEGATIVE
Nitrite: NEGATIVE
PH: 5 (ref 5.0–8.0)
Protein, ur: 100 mg/dL — AB
Specific Gravity, Urine: 1.011 (ref 1.005–1.030)

## 2017-11-17 LAB — I-STAT BETA HCG BLOOD, ED (MC, WL, AP ONLY): I-stat hCG, quantitative: <5 m[IU]/mL (ref <5)

## 2017-11-17 MED ORDER — IOPAMIDOL (ISOVUE-370) INJECTION 76%
50.0000 mL | Freq: Once | INTRAVENOUS | Status: AC | PRN
  Administered 2017-11-17: 50 mL via INTRAVENOUS

## 2017-11-17 MED ORDER — KETOROLAC TROMETHAMINE 15 MG/ML IJ SOLN
15.0000 mg | Freq: Once | INTRAMUSCULAR | Status: AC
  Administered 2017-11-17: 15 mg via INTRAVENOUS
  Filled 2017-11-17: qty 1

## 2017-11-17 MED ORDER — METOCLOPRAMIDE HCL 5 MG/ML IJ SOLN
10.0000 mg | Freq: Once | INTRAMUSCULAR | Status: AC
  Administered 2017-11-17: 10 mg via INTRAVENOUS
  Filled 2017-11-17: qty 2

## 2017-11-17 MED ORDER — DEXAMETHASONE SODIUM PHOSPHATE 10 MG/ML IJ SOLN
10.0000 mg | Freq: Once | INTRAMUSCULAR | Status: AC
  Administered 2017-11-18: 10 mg via INTRAVENOUS
  Filled 2017-11-17: qty 1

## 2017-11-17 MED ORDER — DIPHENHYDRAMINE HCL 50 MG/ML IJ SOLN
25.0000 mg | Freq: Once | INTRAMUSCULAR | Status: AC
  Administered 2017-11-17: 25 mg via INTRAVENOUS
  Filled 2017-11-17: qty 1

## 2017-11-17 MED ORDER — SODIUM CHLORIDE 0.9 % IV BOLUS
1000.0000 mL | Freq: Once | INTRAVENOUS | Status: AC
  Administered 2017-11-17: 1000 mL via INTRAVENOUS

## 2017-11-17 MED ORDER — LORAZEPAM 2 MG/ML IJ SOLN
1.0000 mg | Freq: Once | INTRAMUSCULAR | Status: AC
  Administered 2017-11-18: 1 mg via INTRAVENOUS
  Filled 2017-11-17 (×2): qty 1

## 2017-11-17 NOTE — ED Provider Notes (Signed)
[facility] EMERGENCY DEPARTMENT Provider Note   CSN: [id number] Arrival date & time: 11/17/17  1607     History   Chief Complaint Chief Complaint  Patient presents with  . Dizziness  . Neck Pain    HPI Carla Mack is a 52 y.o. female.  HPI  52 year old female with history of lupus and DVT here with headache and neck pain.  Patient states that for the last several days, she has had an intermittent right sided aching, throbbing headache.  Earlier today, patient began to notice tingling in her right upper arm along with right neck pain and worsening of her headache.  The neck pain is aching, throbbing, along her right neck and is worse with movement and palpation.  She also states she had some blurred vision and felt like her eye was tearing, though she has a history of migraines with somewhat similar symptoms.  She denies any focal numbness or weakness.  The numbness was described as a tingling sensation along her right upper and lateral arm and did not extend below the elbow.  Denies any lower extremity symptoms.  No known history of stroke.  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  She is not on any regular medications for her lupus.  No other complaints.  Past Medical History:  Diagnosis Date  . DVT (deep venous thrombosis) (HCC)   . History of cardiac arrest   . Lupus (HCC)     There are no active problems to display for this patient.   Past Surgical History:  Procedure Laterality Date  . CESAREAN SECTION       OB History   None      Home Medications    Prior to Admission medications   Medication Sig Start Date End Date Taking? Authorizing Provider  aspirin 325 MG tablet Take 325 mg by mouth daily.    [provider]  calcium citrate-vitamin D (CITRACAL+D) 315-200 MG-UNIT per tablet Take 1 tablet by mouth 2 (two) times daily.    [provider]    cholecalciferol (VITAMIN D) 1000 UNITS tablet Take 1,000 Units by mouth daily.    [provider]  Coenzyme Q10 (CO Q 10 PO) Take 1 capsule by mouth daily.     [provider]  cyclobenzaprine (FLEXERIL) 10 MG tablet Take 1 tablet (10 mg total) by mouth 2 (two) times daily as needed for muscle spasms. 02/10/12   Marlon Pel, PA-C  diazepam (VALIUM) 5 MG tablet Take 1 tablet (5 mg total) by mouth every 8 (eight) hours as needed. 03/29/16   Linwood Dibbles, MD  etodolac (LODINE) 500 MG tablet Take 1 tablet (500 mg total) by mouth 2 (two) times daily. 03/29/16   Linwood Dibbles, MD  fish oil-omega-3 fatty acids 1000 MG capsule Take 2 g by mouth daily.    [provider]  ibuprofen (ADVIL,MOTRIN) 800 MG tablet Take 1 tablet (800 mg total) by mouth 3 (three) times daily. 02/10/12   Marlon Pel, PA-C  LYSINE PO Take 1 tablet by mouth daily.     [provider]  Specialty Vitamins Products (MAGNESIUM, AMINO ACID CHELATE,) 133 MG tablet Take 1 tablet by mouth 2 (two) times daily.    [provider]  vitamin C (ASCORBIC ACID) 500 MG tablet Take 500 mg by mouth daily.    [provider]    Family History History reviewed. No pertinent family  history.  Social History Social History   Tobacco Use  . Smoking status: Never Smoker  . Smokeless tobacco: Never Used  Substance Use Topics  . Alcohol use: Yes  . Drug use: No     Allergies   Morphine and related and Promethazine hcl   Review of Systems Review of Systems  Constitutional: Positive for fatigue. Negative for chills and fever.  HENT: Negative for congestion and rhinorrhea.   Eyes: Negative for visual disturbance.  Respiratory: Negative for cough, shortness of breath and wheezing.   Cardiovascular: Negative for chest pain and leg swelling.  Gastrointestinal: Negative for abdominal pain, diarrhea, nausea and vomiting.  Genitourinary: Negative for dysuria and flank pain.   Musculoskeletal: Positive for neck pain and neck stiffness.  Skin: Negative for rash and wound.  Allergic/Immunologic: Negative for immunocompromised state.  Neurological: Positive for headaches. Negative for syncope and weakness.  All other systems reviewed and are negative.    Physical Exam Updated Vital Signs BP (!) 144/85   Pulse 88   Temp 99 F (37.2 C) (Oral)   Resp 18   SpO2 99%   Physical Exam  Constitutional: She is oriented to person, place, and time. She appears well-developed and well-nourished. No distress.  HENT:  Head: Normocephalic and atraumatic.  Eyes: Conjunctivae are normal.  Neck: Neck supple.  TTP over right lateral paraspinal musculature. No midline TTP.  Cardiovascular: Normal rate, regular rhythm and normal heart sounds. Exam reveals no friction rub.  No murmur heard. Pulmonary/Chest: Effort normal and breath sounds normal. No respiratory distress. She has no wheezes. She has no rales.  Abdominal: She exhibits no distension.  Musculoskeletal: She exhibits no edema.  Neurological: She is alert and oriented to person, place, and time. She exhibits normal muscle tone.  Skin: Skin is warm. Capillary refill takes less than 2 seconds.  Psychiatric: She has a normal mood and affect.  Nursing note and vitals reviewed.   Neurological Exam:  Mental Status: Alert and oriented to person, place, and time. Attention and concentration normal. Speech clear. Recent memory is intact. Cranial Nerves: Visual fields grossly intact. EOMI and PERRLA. No nystagmus noted. Facial sensation intact at forehead, maxillary cheek, and chin/mandible bilaterally. No facial asymmetry or weakness. Hearing grossly normal. Uvula is midline, and palate elevates symmetrically. Normal SCM and trapezius strength. Tongue midline without fasciculations. Motor: Muscle strength 5/5 in proximal and distal UE and LE bilaterally. No pronator drift. Muscle tone normal. Reflexes: 2+ and symmetrical  in all four extremities.  Sensation: Intact to light touch in upper and lower extremities distally bilaterally.  Gait: Normal without ataxia. Coordination: Normal FTN bilaterally.     ED Treatments / Results  Labs (all labs ordered are listed, but only abnormal results are displayed) Labs Reviewed  BASIC METABOLIC PANEL - Abnormal; Notable for the following components:      Result Value   BUN 25 (*)    Creatinine, Ser 1.49 (*)    GFR calc non Af Amer 39 (*)    GFR calc Af Amer 45 (*)    All other components within normal limits  CBC - Abnormal; Notable for the following components:   WBC 3.7 (*)    Platelets 132 (*)    All other components within normal limits  URINALYSIS, ROUTINE W REFLEX MICROSCOPIC - Abnormal; Notable for the following components:   APPearance HAZY (*)    Hgb urine dipstick LARGE (*)    Protein, ur 100 (*)    RBC / HPF >  50 (*)    Bacteria, UA RARE (*)    All other components within normal limits  I-STAT BETA HCG BLOOD, ED ([facility], [facility], [facility] ONLY)    EKG None  Radiology Ct Angio Head W Or Wo Contrast  Result Date: 11/17/2017 CLINICAL DATA:  Acute onset RIGHT neck pain radiating to RIGHT arm, dizziness and RIGHT eye watering for 2 days. History of lupus, cardiac arrest. EXAM: CT ANGIOGRAPHY HEAD AND NECK TECHNIQUE: Multidetector CT imaging of the head and neck was performed using the standard protocol during bolus administration of intravenous contrast. Multiplanar CT image reconstructions and MIPs were obtained to evaluate the vascular anatomy. Carotid stenosis measurements (when applicable) are obtained utilizing NASCET criteria, using the distal internal carotid diameter as the denominator. CONTRAST:  50mL ISOVUE-370 IOPAMIDOL (ISOVUE-370) INJECTION 76% COMPARISON:  None. FINDINGS: CT HEAD FINDINGS BRAIN: No intraparenchymal hemorrhage, mass effect nor midline shift. The ventricles and sulci are normal. No acute large vascular territory infarcts. No abnormal  extra-axial fluid collections. Basal cisterns are patent. VASCULAR: Trace calcific atherosclerosis. SKULL/SOFT TISSUES: No skull fracture. No significant soft tissue swelling. ORBITS/SINUSES: The included ocular globes and orbital contents are normal.The mastoid aircells and included paranasal sinuses are well-aerated. OTHER: None. CTA NECK FINDINGS: AORTIC ARCH: Normal appearance of the thoracic arch, 2 vessel arch is a normal variant. The origins of the innominate, left Common carotid artery and subclavian artery are widely patent. RIGHT CAROTID SYSTEM: Common carotid artery is patent. Normal appearance of the carotid bifurcation without hemodynamically significant stenosis by NASCET criteria. Normal appearance of the internal carotid artery. LEFT CAROTID SYSTEM: Common carotid artery is patent. Normal appearance of the carotid bifurcation without hemodynamically significant stenosis by NASCET criteria. Normal appearance of the internal carotid artery. VERTEBRAL ARTERIES:Left vertebral artery is dominant. Normal appearance of the vertebral arteries, widely patent. Diminutive RIGHT vertebral artery with compensatory smaller foramen transversarium. SKELETON: No acute osseous process though bone windows have not been submitted. Severe C5-6 disc height loss and endplate spurring compatible with degenerative discs, moderate C6-7. Moderate to severe RIGHT neural foraminal narrowing. OTHER NECK: Soft tissues of the neck are nonacute though, not tailored for evaluation. UPPER CHEST: Included lung apices are clear. No superior mediastinal lymphadenopathy. CTA HEAD FINDINGS: ANTERIOR CIRCULATION: Patent cervical internal carotid arteries, petrous, cavernous and supra clinoid internal carotid arteries. Patent anterior communicating artery. Patent anterior and middle cerebral arteries, mild luminal irregularity mid to distal segments. No large vessel occlusion, significant stenosis, contrast extravasation or aneurysm.  POSTERIOR CIRCULATION: Patent vertebral arteries, vertebrobasilar junction and basilar artery, as well as main branch vessels. RIGHT vertebral artery terminates in the posterior inferior cerebellar artery. Patent posterior cerebral arteries. Robust bilateral posterior communicating arteries, fetal origin RIGHT PCA. No large vessel occlusion, significant stenosis, contrast extravasation or aneurysm. VENOUS SINUSES: Major dural venous sinuses are patent though not tailored for evaluation on this angiographic examination. ANATOMIC VARIANTS: None. DELAYED PHASE: No abnormal intracranial enhancement. MIP images reviewed. IMPRESSION: CT HEAD: 1. Normal CT HEAD with and without contrast. CTA NECK: 1. Normal CTA NECK. 2. Moderate to severe RIGHT C5-6 neural foraminal narrowing. CTA HEAD: 1. No emergent large vessel occlusion or flow-limiting stenosis. 2. Mild intracranial atherosclerosis versus artifact. Electronically Signed   By: Awilda Metroourtnay  Bloomer M.D.   On: 11/17/2017 23:22   Ct Angio Neck W And/or Wo Contrast  Result Date: 11/17/2017 CLINICAL DATA:  Acute onset RIGHT neck pain radiating to RIGHT arm, dizziness and RIGHT eye watering for 2 days. History of lupus, cardiac arrest. EXAM:  CT ANGIOGRAPHY HEAD AND NECK TECHNIQUE: Multidetector CT imaging of the head and neck was performed using the standard protocol during bolus administration of intravenous contrast. Multiplanar CT image reconstructions and MIPs were obtained to evaluate the vascular anatomy. Carotid stenosis measurements (when applicable) are obtained utilizing NASCET criteria, using the distal internal carotid diameter as the denominator. CONTRAST:  50mL ISOVUE-370 IOPAMIDOL (ISOVUE-370) INJECTION 76% COMPARISON:  None. FINDINGS: CT HEAD FINDINGS BRAIN: No intraparenchymal hemorrhage, mass effect nor midline shift. The ventricles and sulci are normal. No acute large vascular territory infarcts. No abnormal extra-axial fluid collections. Basal cisterns  are patent. VASCULAR: Trace calcific atherosclerosis. SKULL/SOFT TISSUES: No skull fracture. No significant soft tissue swelling. ORBITS/SINUSES: The included ocular globes and orbital contents are normal.The mastoid aircells and included paranasal sinuses are well-aerated. OTHER: None. CTA NECK FINDINGS: AORTIC ARCH: Normal appearance of the thoracic arch, 2 vessel arch is a normal variant. The origins of the innominate, left Common carotid artery and subclavian artery are widely patent. RIGHT CAROTID SYSTEM: Common carotid artery is patent. Normal appearance of the carotid bifurcation without hemodynamically significant stenosis by NASCET criteria. Normal appearance of the internal carotid artery. LEFT CAROTID SYSTEM: Common carotid artery is patent. Normal appearance of the carotid bifurcation without hemodynamically significant stenosis by NASCET criteria. Normal appearance of the internal carotid artery. VERTEBRAL ARTERIES:Left vertebral artery is dominant. Normal appearance of the vertebral arteries, widely patent. Diminutive RIGHT vertebral artery with compensatory smaller foramen transversarium. SKELETON: No acute osseous process though bone windows have not been submitted. Severe C5-6 disc height loss and endplate spurring compatible with degenerative discs, moderate C6-7. Moderate to severe RIGHT neural foraminal narrowing. OTHER NECK: Soft tissues of the neck are nonacute though, not tailored for evaluation. UPPER CHEST: Included lung apices are clear. No superior mediastinal lymphadenopathy. CTA HEAD FINDINGS: ANTERIOR CIRCULATION: Patent cervical internal carotid arteries, petrous, cavernous and supra clinoid internal carotid arteries. Patent anterior communicating artery. Patent anterior and middle cerebral arteries, mild luminal irregularity mid to distal segments. No large vessel occlusion, significant stenosis, contrast extravasation or aneurysm. POSTERIOR CIRCULATION: Patent vertebral arteries,  vertebrobasilar junction and basilar artery, as well as main branch vessels. RIGHT vertebral artery terminates in the posterior inferior cerebellar artery. Patent posterior cerebral arteries. Robust bilateral posterior communicating arteries, fetal origin RIGHT PCA. No large vessel occlusion, significant stenosis, contrast extravasation or aneurysm. VENOUS SINUSES: Major dural venous sinuses are patent though not tailored for evaluation on this angiographic examination. ANATOMIC VARIANTS: None. DELAYED PHASE: No abnormal intracranial enhancement. MIP images reviewed. IMPRESSION: CT HEAD: 1. Normal CT HEAD with and without contrast. CTA NECK: 1. Normal CTA NECK. 2. Moderate to severe RIGHT C5-6 neural foraminal narrowing. CTA HEAD: 1. No emergent large vessel occlusion or flow-limiting stenosis. 2. Mild intracranial atherosclerosis versus artifact. Electronically Signed   By: Awilda Metro M.D.   On: 11/17/2017 23:22    Procedures Procedures (including critical care time)  Medications Ordered in ED Medications  dexamethasone (DECADRON) injection 10 mg (has no administration in time range)  diphenhydrAMINE (BENADRYL) injection 25 mg (25 mg Intravenous Given 11/17/17 2220)  metoCLOPramide (REGLAN) injection 10 mg (10 mg Intravenous Given 11/17/17 2220)  ketorolac (TORADOL) 15 MG/ML injection 15 mg (15 mg Intravenous Given 11/17/17 2220)  sodium chloride 0.9 % bolus 1,000 mL (1,000 mLs Intravenous New Bag/Given 11/17/17 2227)  iopamidol (ISOVUE-370) 76 % injection 50 mL (50 mLs Intravenous Contrast Given 11/17/17 2247)     Initial Impression / Assessment and Plan / ED Course  I have  reviewed the triage vital signs and the nursing notes.  Pertinent labs & imaging results that were available during my care of the patient were reviewed by me and considered in my medical decision making (see chart for details).     52 year old female with past medical history of lupus and history of DVT here with  right-sided neck and headache.  Patient also with reported blurred vision.  On exam, patient with no obvious weakness or numbness in her arms or legs.  She does have a symmetric palatal elevation.  While this may be secondary to history of surgery in this area, given her history of lupus cannot rule out possible underlying demyelinating condition, small stroke, or vascular abnormality.  CT angios largely unremarkable though does show foraminal stenosis on the right, which could be consistent with her reported arm numbness.  Discussed with Dr. Jerrell Belfast of neurology.  Will obtain MRI.  If negative, can likely be discharged.  Patient does feel better with symptomatic tx here.  Patient care transferred to Dr. Bebe Shaggy at the end of my shift. Patient presentation, ED course, and plan of care discussed with review of all pertinent labs and imaging. Please see his/her note for further details regarding further ED course and disposition.   Final Clinical Impressions(s) / ED Diagnoses   Final diagnoses:  Paresthesia  Foraminal stenosis of cervical region    ED Discharge Orders    None       Shaune Pollack, MD 11/17/17 2356

## 2017-11-17 NOTE — ED Triage Notes (Signed)
Pt presents with [facility].  Patient presents for assessment of sudden onset of right neck pain radiating in to her right arm, dizziness and right eye watering x 2 days.  Stroke scale negative.  Hx of DVT and lupus.  Only daily med is 81 mg aspirin.

## 2017-11-18 ENCOUNTER — Emergency Department (HOSPITAL_COMMUNITY): Payer: 59

## 2017-11-18 NOTE — ED Notes (Signed)
Pt resting on stretcher with eyes closed, RR even and unlabored, NAD, VSS 

## 2017-11-18 NOTE — ED Notes (Signed)
Pt returned from MRI °

## 2017-11-18 NOTE — Discharge Instructions (Addendum)
You have neck pain, possibly from a cervical strain and/or pinched nerve.  ° °SEEK IMMEDIATE MEDICAL ATTENTION IF: °You develop difficulties swallowing or breathing.  °You have new or worse numbness, weakness, tingling, or movement problems in your arms or legs.  °You develop increasing pain which is uncontrolled with medications.  °You have change in bowel or bladder function, or other concerns. ° ° ° °

## 2017-11-18 NOTE — ED Notes (Signed)
Pt to MRI via stretcher.

## 2017-11-18 NOTE — ED Provider Notes (Signed)
MRIs are negative for any acute process.  Patient feels comfortable discharge home.  Will refer to neurosurgery for C-spine issues.   Zadie RhineWickline, Rondle Lohse, MD 11/18/17 [phone]

## 2017-11-18 NOTE — ED Provider Notes (Signed)
Patient awaiting MRI.  No other acute complaints at this time.  If negative she can be discharged   Zadie RhineWickline, Temesha Queener, MD 11/18/17 0131

## 2017-12-08 ENCOUNTER — Emergency Department (HOSPITAL_COMMUNITY): Payer: 59

## 2017-12-08 ENCOUNTER — Encounter (HOSPITAL_COMMUNITY): Payer: Self-pay

## 2017-12-08 ENCOUNTER — Emergency Department (HOSPITAL_COMMUNITY)
Admission: EM | Admit: 2017-12-08 | Discharge: 2017-12-08 | Disposition: A | Discharge status: Home / Self Care | Payer: 59 | Attending: Emergency Medicine | Admitting: Emergency Medicine

## 2017-12-08 DIAGNOSIS — R0602 Shortness of breath: Secondary | ICD-10-CM | POA: Insufficient documentation

## 2017-12-08 DIAGNOSIS — Z79899 Other long term (current) drug therapy: Secondary | ICD-10-CM | POA: Insufficient documentation

## 2017-12-08 DIAGNOSIS — Z86718 Personal history of other venous thrombosis and embolism: Secondary | ICD-10-CM | POA: Diagnosis not present

## 2017-12-08 DIAGNOSIS — Z7982 Long term (current) use of aspirin: Secondary | ICD-10-CM | POA: Diagnosis not present

## 2017-12-08 DIAGNOSIS — R079 Chest pain, unspecified: Secondary | ICD-10-CM

## 2017-12-08 DIAGNOSIS — R000 Tachycardia, unspecified: Secondary | ICD-10-CM | POA: Diagnosis not present

## 2017-12-08 DIAGNOSIS — I252 Old myocardial infarction: Secondary | ICD-10-CM | POA: Insufficient documentation

## 2017-12-08 DIAGNOSIS — R2243 Localized swelling, mass and lump, lower limb, bilateral: Secondary | ICD-10-CM | POA: Insufficient documentation

## 2017-12-08 LAB — CBC WITH DIFFERENTIAL/PLATELET
ABS IMMATURE GRANULOCYTES: 0 10*3/uL (ref 0.0–0.1)
BASOS ABS: 0 10*3/uL (ref 0.0–0.1)
Basophils Relative: 1 %
EOS ABS: 0 10*3/uL (ref 0.0–0.7)
Eosinophils Relative: 1 %
HCT: 41.1 % (ref 36.0–46.0)
Hemoglobin: 13.8 g/dL (ref 12.0–15.0)
IMMATURE GRANULOCYTES: 1 %
Lymphocytes Relative: 26 %
Lymphs Abs: 0.9 10*3/uL (ref 0.7–4.0)
MCH: 30.5 pg (ref 26.0–34.0)
MCHC: 33.6 g/dL (ref 30.0–36.0)
MCV: 90.9 fL (ref 78.0–100.0)
Monocytes Absolute: 0.3 10*3/uL (ref 0.1–1.0)
Monocytes Relative: 10 %
NEUTROS ABS: 2 10*3/uL (ref 1.7–7.7)
NEUTROS PCT: 61 %
Platelets: 112 10*3/uL — ABNORMAL LOW (ref 150–400)
RBC: 4.52 MIL/uL (ref 3.87–5.11)
RDW: 12.6 % (ref 11.5–15.5)
WBC: 3.2 10*3/uL — AB (ref 4.0–10.5)

## 2017-12-08 LAB — BASIC METABOLIC PANEL
ANION GAP: 10 (ref 5–15)
BUN: 28 mg/dL — ABNORMAL HIGH (ref 6–20)
CHLORIDE: 104 mmol/L (ref 98–111)
CO2: 24 mmol/L (ref 22–32)
Calcium: 9.6 mg/dL (ref 8.9–10.3)
Creatinine, Ser: 1.41 mg/dL — ABNORMAL HIGH (ref 0.44–1.00)
GFR, EST AFRICAN AMERICAN: 48 mL/min — AB (ref >60)
GFR, EST NON AFRICAN AMERICAN: 42 mL/min — AB (ref >60)
Glucose, Bld: 101 mg/dL — ABNORMAL HIGH (ref 70–99)
POTASSIUM: 4.5 mmol/L (ref 3.5–5.1)
Sodium: 138 mmol/L (ref 135–145)

## 2017-12-08 LAB — I-STAT TROPONIN, ED: TROPONIN I, POC: 0 ng/mL (ref 0.00–0.08)

## 2017-12-08 LAB — APTT: APTT: 85 s — AB (ref 24–36)

## 2017-12-08 LAB — PROTIME-INR
INR: 1.11
PROTHROMBIN TIME: 14.2 s (ref 11.4–15.2)

## 2017-12-08 MED ORDER — LIDOCAINE-EPINEPHRINE 1 %-1:100000 IJ SOLN: 10.0000 mL | Freq: Once | INTRAMUSCULAR | Status: DC

## 2017-12-08 MED ORDER — ETOMIDATE 2 MG/ML IV SOLN: 7.0000 mg | Freq: Once | INTRAVENOUS | Status: DC

## 2017-12-08 NOTE — ED Triage Notes (Addendum)
Patient complains of feeling a sudden chest heaviness in her chest while driving 1 hour pta. Just released from hospital [facility] 2 days ago for DVT and PE. Taking coumadin and lovenox as prescribed. Reports that she feels as if something moved into her chest. No SOB, appears uncomfortable

## 2017-12-08 NOTE — ED Provider Notes (Signed)
I saw and evaluated the patient, reviewed the resident's note and I agree with the findings and plan.  EKG: EKG Interpretation  Date/Time:  Friday December 08 2017 14:36:53 EDT Ventricular Rate:  117 PR Interval:    QRS Duration: 124 QT Interval:  316 QTC Calculation: 440 R Axis:   61 Text Interpretation:  Normal sinus rhythm Non-specific intra-ventricular conduction delay Borderline ECG Confirmed by Raeford RazorKohut, Malayiah Mcbrayer [phone]([id number]) on 12/08/2017 [id number] PM   52 year old female with chest pain.  Sounds atypical for ACS.  EKG is not overtly changed from prior.  She has a known recently diagnosed PE/DVT.  INR still subtherapeutic but she reports compliance with Lovenox.  Mild tachycardia noted but she appears comfortable.  Oxygen saturations are normal on room air.  She is not hypotensive.  Troponin is normal.  Pain may be related to this PE.  Also said suspect that there might be some component of anxiety as well.  She reports that she has a scheduled INR check this upcoming Tuesday and a hematology appointment scheduled for Wednesday.  Emergent return precautions were discussed.  Dg Chest 2 View  Result Date: 12/08/2017 CLINICAL DATA:  Initial evaluation for acute chest pain. EXAM: CHEST - 2 VIEW COMPARISON:  Prior radiograph from 03/15/2005. FINDINGS: The cardiac and mediastinal silhouettes are stable in size and contour, and remain within normal limits. The lungs are normally inflated. No airspace consolidation, pleural effusion, or pulmonary edema is identified. There is no pneumothorax. No acute osseous abnormality identified. IMPRESSION: No active cardiopulmonary disease. Electronically Signed   By: Rise MuBenjamin  McClintock M.D.   On: 12/08/2017 16:50     Raeford RazorKohut, Sheridan Gettel, MD 12/08/17 [phone]

## 2017-12-08 NOTE — ED Notes (Signed)
Patient transported to X-ray 

## 2017-12-08 NOTE — ED Provider Notes (Signed)
[facility] Provider Note   CSN: [id number] Arrival date & time: 12/08/17  1429   History   Chief Complaint Chief Complaint  Patient presents with  . CP/ treated for DVT    HPI Redmond SchoolDenise C Mack is a 52 y.o. female.  HPI 52 year old female with history of lupus and recurrent DVTs presents to the emergency department today for evaluation of chest pain after [facility] for diagnosis of bilateral lower extreme DVTs and right lower lobe PE.  Patient was discharged on Wednesday night and has since been taking Lovenox twice daily as well as Coumadin.  States that she saw her PCP this morning and had INR 1.1.  While driving back to work she states that "I felt something move in my chest like peanut butter and felt pressure in my chest." Describes SOA and diaphoresis with this. Continues to complain of pressure in her chest in ED. Denies any history of cardiac disease/CAD. States she continues to have swelling of her RLE though no discoloration or leg pain. Was taking aspirin but no longer on antiplatelets since starting anticoagulants.   Past Medical History:  Diagnosis Date  . DVT (deep venous thrombosis) (HCC)   . History of cardiac arrest   . Lupus (HCC)     There are no active problems to display for this patient.   Past Surgical History:  Procedure Laterality Date  . CESAREAN SECTION       OB History   None      Home Medications    Prior to Admission medications   Medication Sig Start Date End Date Taking? Authorizing Provider  aspirin 325 MG tablet Take 325 mg by mouth daily.    [provider]  calcium citrate-vitamin D (CITRACAL+D) 315-200 MG-UNIT per tablet Take 1 tablet by mouth 2 (two) times daily.    [provider]  cholecalciferol (VITAMIN D) 1000 UNITS tablet Take 1,000 Units by mouth daily.    [provider]  Coenzyme Q10 (CO Q 10 PO) Take 1 capsule by mouth daily.      [provider]  cyclobenzaprine (FLEXERIL) 10 MG tablet Take 1 tablet (10 mg total) by mouth 2 (two) times daily as needed for muscle spasms. 02/10/12   Marlon PelGreene, Tiffany, PA-C  diazepam (VALIUM) 5 MG tablet Take 1 tablet (5 mg total) by mouth every 8 (eight) hours as needed. 03/29/16   Linwood DibblesKnapp, Jon, MD  etodolac (LODINE) 500 MG tablet Take 1 tablet (500 mg total) by mouth 2 (two) times daily. 03/29/16   Linwood DibblesKnapp, Jon, MD  fish oil-omega-3 fatty acids 1000 MG capsule Take 2 g by mouth daily.    [provider]  ibuprofen (ADVIL,MOTRIN) 800 MG tablet Take 1 tablet (800 mg total) by mouth 3 (three) times daily. 02/10/12   Marlon PelGreene, Tiffany, PA-C  LYSINE PO Take 1 tablet by mouth daily.     [provider]  Specialty Vitamins Products (MAGNESIUM, AMINO ACID CHELATE,) 133 MG tablet Take 1 tablet by mouth 2 (two) times daily.    [provider]  vitamin C (ASCORBIC ACID) 500 MG tablet Take 500 mg by mouth daily.    [provider]    Family History No family history on file.  Social History Social History   Tobacco Use  . Smoking status: Never Smoker  . Smokeless tobacco: Never Used  Substance Use Topics  . Alcohol use: Yes  . Drug use: No  Allergies   Morphine and related; Promethazine; and Promethazine hcl   Review of Systems Review of Systems  Constitutional: Positive for diaphoresis. Negative for chills and fever.  HENT: Negative for congestion and sore throat.   Eyes: Negative for visual disturbance.  Respiratory: Positive for shortness of breath. Negative for cough.   Cardiovascular: Positive for chest pain and leg swelling.  Gastrointestinal: Negative for abdominal pain, diarrhea, nausea and vomiting.  Genitourinary: Negative for dysuria and hematuria.  Musculoskeletal: Negative for back pain and neck pain.  Skin: Negative for color change and rash.  Neurological: Negative for weakness and headaches.  All other systems reviewed and  are negative.    Physical Exam Updated Vital Signs BP (!) 133/98   Pulse (!) 106   Temp 98.3 F (36.8 C) (Oral)   Resp 20   Ht 5\' 4"  (1.626 m)   Wt 68.9 kg   SpO2 99%   BMI 26.09 kg/m   Physical Exam  Constitutional: She appears well-developed and well-nourished. No distress.  HENT:  Head: Normocephalic and atraumatic.  Eyes: Conjunctivae are normal.  Neck: Neck supple.  Cardiovascular: Regular rhythm and intact distal pulses. Exam reveals no friction rub.  No murmur heard. 2+ symmetric radial pulses bilaterally.   Pulmonary/Chest: Effort normal and breath sounds normal. No respiratory distress.  Abdominal: Soft. She exhibits no distension. There is no tenderness. There is no rebound and no guarding.  Musculoskeletal: She exhibits edema (1+ BLE).  Neurological: She is alert.  Skin: Skin is warm and dry. She is not diaphoretic.  Psychiatric: She has a normal mood and affect.  Nursing note and vitals reviewed.    ED Treatments / Results  Labs (all labs ordered are listed, but only abnormal results are displayed) Labs Reviewed  BASIC METABOLIC PANEL - Abnormal; Notable for the following components:      Result Value   Glucose, Bld 101 (*)    BUN 28 (*)    Creatinine, Ser 1.41 (*)    GFR calc non Af Amer 42 (*)    GFR calc Af Amer 48 (*)    All other components within normal limits  CBC WITH DIFFERENTIAL/PLATELET - Abnormal; Notable for the following components:   WBC 3.2 (*)    Platelets 112 (*)    All other components within normal limits  APTT - Abnormal; Notable for the following components:   aPTT 85 (*)    All other components within normal limits  PROTIME-INR  I-STAT TROPONIN, ED    EKG EKG Interpretation  Date/Time:  Friday December 08 2017 14:36:53 EDT Ventricular Rate:  117 PR Interval:    QRS Duration: 124 QT Interval:  316 QTC Calculation: 440 R Axis:   61 Text Interpretation:  Normal sinus rhythm Non-specific intra-ventricular conduction  delay Borderline ECG Confirmed by Raeford Razor [phone]) on 12/08/2017 3:59:52 PM   Radiology Dg Chest 2 View  Result Date: 12/08/2017 CLINICAL DATA:  Initial evaluation for acute chest pain. EXAM: CHEST - 2 VIEW COMPARISON:  Prior radiograph from 03/15/2005. FINDINGS: The cardiac and mediastinal silhouettes are stable in size and contour, and remain within normal limits. The lungs are normally inflated. No airspace consolidation, pleural effusion, or pulmonary edema is identified. There is no pneumothorax. No acute osseous abnormality identified. IMPRESSION: No active cardiopulmonary disease. Electronically Signed   By: Rise Mu M.D.   On: 12/08/2017 16:50    Procedures Procedures (including critical care time)  Medications Ordered in ED Medications - No data to display  Initial Impression / Assessment and Plan / ED Course  I have reviewed the triage vital signs and the nursing notes.  Pertinent labs & imaging results that were available during my care of the patient were reviewed by me and considered in my medical decision making (see chart for details).    52 year old female with history of lupus and recurrent DVTs presents to the emergency department today for evaluation of chest pain after recent admission at [facility] for diagnosis of bilateral lower extreme DVTs and right lower lobe PE.  Pt afebrile, tachycardic, stable BP, no distress. ECG reviewed with rate of approximately 115, sinus tach, normal axis, nonspecific intraventricular conduction delay, no acute ST elevations or depressions.  No significant T wave abnormalities. When compared to 11/18/17 rate has increased.  No evidence of acute right heart strain at this time.  Labs obtained from quick look process reviewed including BMP, troponin, CBC all of which are unremarkable and stable compared to prior including renal function, platelets, electrolytes.  Troponin undetectable.  INR subtherapeutic at 1.11  however has access and continuing to take Lovenox at home while bridging Coumadin.  We discussed risks/benefits of further work-up.  Patient stable blood pressure.  Stable on room air.  No hypoxia.  She does have mild tachycardia to the low 100s.  She has ongoing follow-up with her PCP early next week as well as hematology for recurrent DVTs.  She is on appropriate medical therapy at this time for her known PE and DVT.  She has no evidence of phlegmasia cerulea dolens at this time symmetric pulses bilateral lower extremities.  No distress.  Patient is stable for discharge.  Strict return precautions provided.  Case and plan of care discussed with Dr. Juleen China.  Final Clinical Impressions(s) / ED Diagnoses   Final diagnoses:  Chest pain, unspecified type    ED Discharge Orders    None       Rigoberto Noel, MD 12/08/17 1738    Raeford Razor, MD 12/14/17 1415

## 2017-12-08 NOTE — ED Provider Notes (Signed)
Patient placed in Quick Look pathway, seen and evaluated   Chief Complaint: Chest pain, shortness of breath  HPI:   Patient presents today for evaluation of sudden chest heaviness while driving about 1 hour prior to arrival.  [facility] was [facility] about [facility] days ago after she was admitted for DVT and PE.  She has been taking both her Coumadin and her Lovenox as prescribed.  She feels like something may have moved into her chest.  She denies any shortness of breath.  She reports that her entire right leg is full with DVT in the middle of her left leg has DVT.  ROS: No fevers  Physical Exam:   Gen: No distress  Neuro: Awake and Alert  Skin: Warm    Focused Exam: Appears anxious, appears uncomfortable.    Initiation of care has begun. The patient has been counseled on the process, plan, and necessity for staying for the completion/evaluation, and the remainder of the medical screening examination.     Cristina GongHammond, Elizabeth W, [place]-C 12/08/17 1535    Sabas SousBero, Michael M, MD 12/08/17 [phone]

## 2017-12-08 NOTE — Discharge Instructions (Addendum)
Your INR (coumadin) level is still not therapeutic.  You need to continue your Lovenox injections.  Follow-up next week as previously scheduled for for repeat INR and also with hematology.  If you feel like your symptoms are getting significantly worse in terms of your pain, shortness of breath, dizziness or you pass out then you need to return to the ER immediately.

## 2018-09-06 ENCOUNTER — Other Ambulatory Visit: Payer: Self-pay

## 2018-09-06 ENCOUNTER — Ambulatory Visit
Admission: RE | Admit: 2018-09-06 | Discharge: 2018-09-06 | Disposition: A | Discharge status: Home / Self Care | Payer: Worker's Compensation | Source: Ambulatory Visit | Attending: Nurse Practitioner | Admitting: Nurse Practitioner

## 2018-09-06 ENCOUNTER — Other Ambulatory Visit: Payer: Self-pay | Admitting: Nurse Practitioner

## 2018-09-06 DIAGNOSIS — M25551 Pain in right hip: Secondary | ICD-10-CM

## 2018-09-06 DIAGNOSIS — M545 Low back pain, unspecified: Secondary | ICD-10-CM

## 2018-09-06 DIAGNOSIS — M25532 Pain in left wrist: Secondary | ICD-10-CM

## 2018-09-06 DIAGNOSIS — M79606 Pain in leg, unspecified: Secondary | ICD-10-CM

## 2019-01-09 ENCOUNTER — Other Ambulatory Visit: Payer: Self-pay | Admitting: Nurse Practitioner

## 2019-01-09 ENCOUNTER — Ambulatory Visit
Admission: RE | Admit: 2019-01-09 | Discharge: 2019-01-09 | Disposition: A | Discharge status: Home / Self Care | Payer: No Typology Code available for payment source | Source: Ambulatory Visit | Attending: Nurse Practitioner | Admitting: Nurse Practitioner

## 2019-01-09 ENCOUNTER — Other Ambulatory Visit: Payer: Self-pay

## 2019-01-09 DIAGNOSIS — Z0000 Encounter for general adult medical examination without abnormal findings: Secondary | ICD-10-CM

## 2019-01-25 IMAGING — DX DG CHEST 2V
2 series · 2 of 2 positions shown · non-contrast
Comparison: Prior radiograph from 03/15/2005.

CLINICAL DATA: Initial evaluation for acute chest pain.

EXAM:
CHEST - 2 VIEW

[w chest pa]
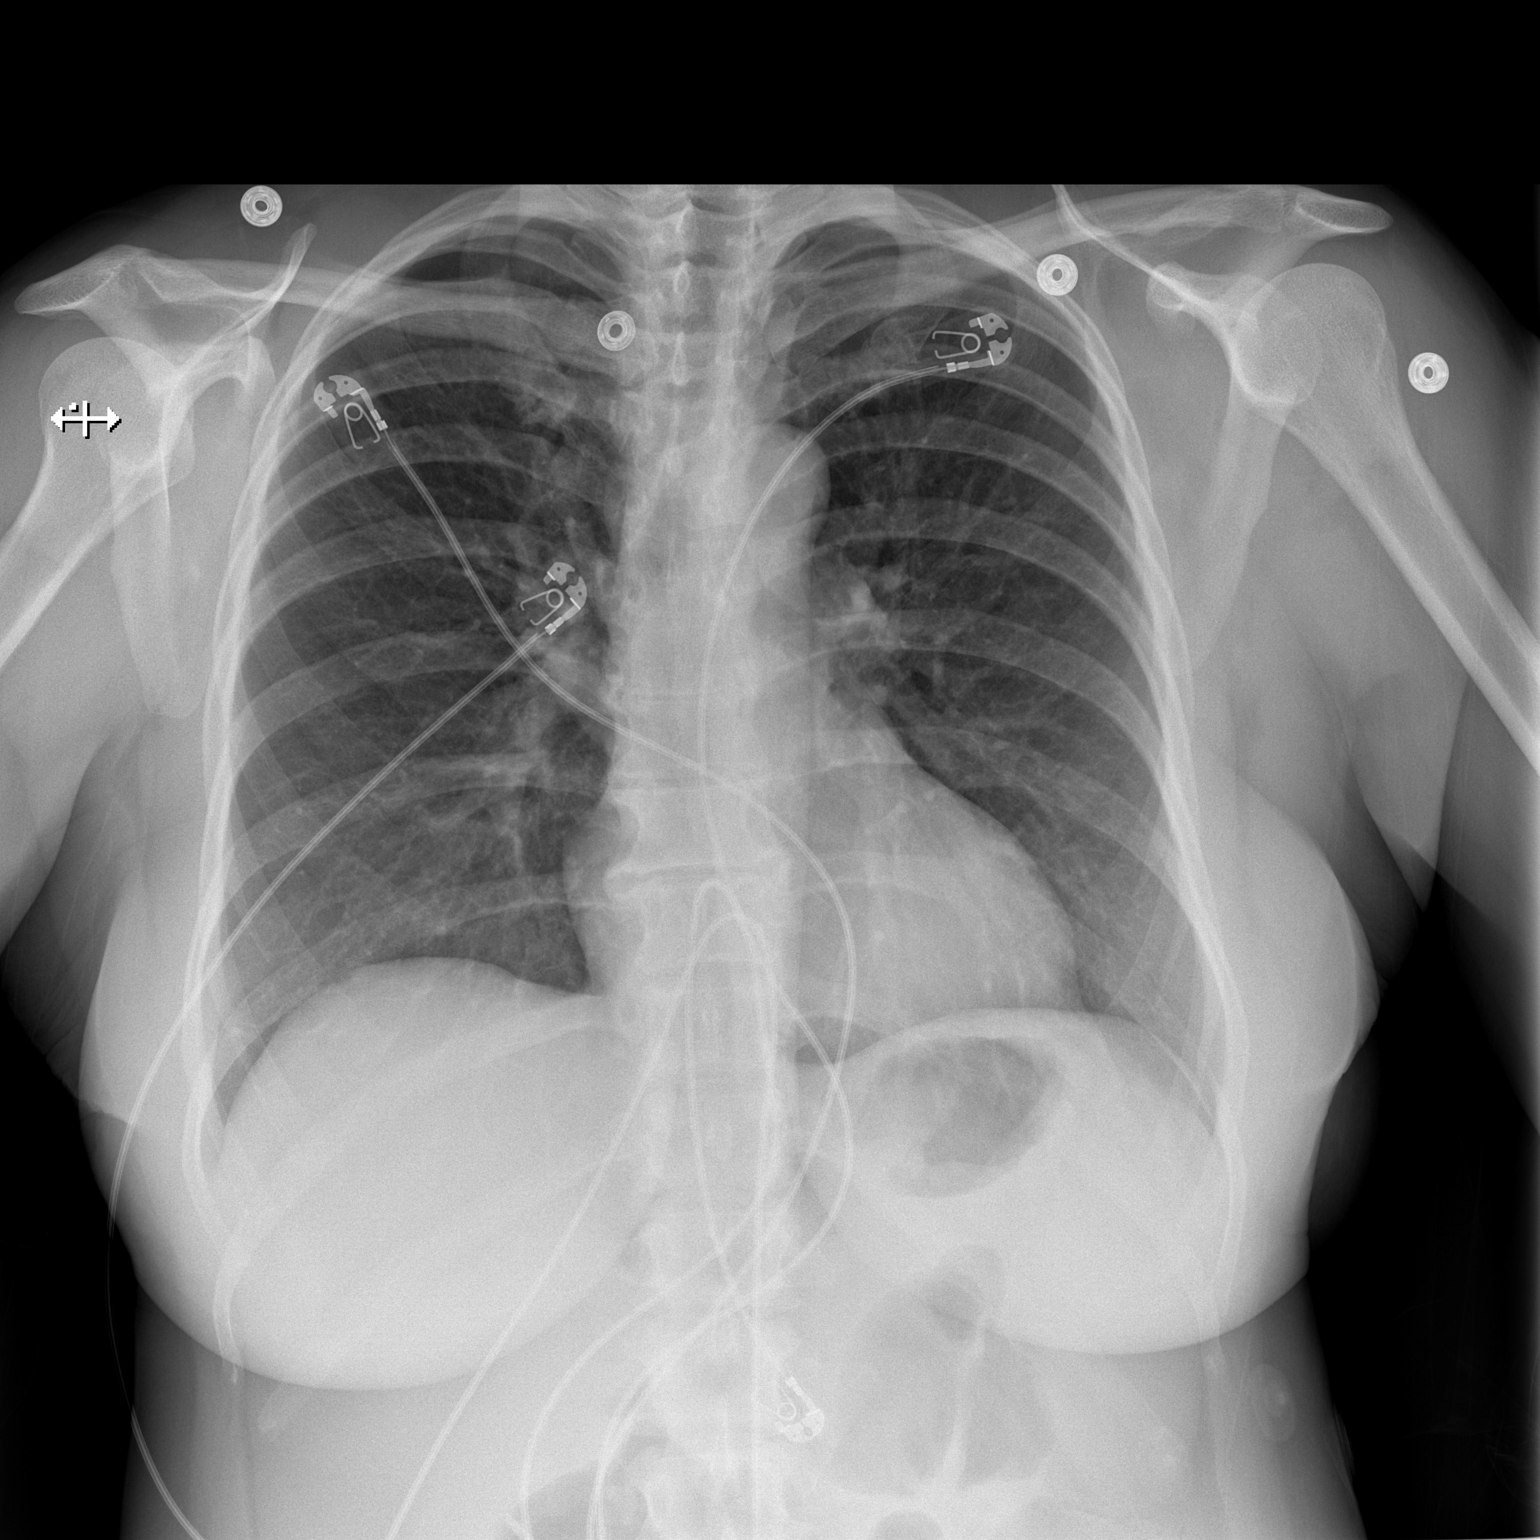

[w chest lat]
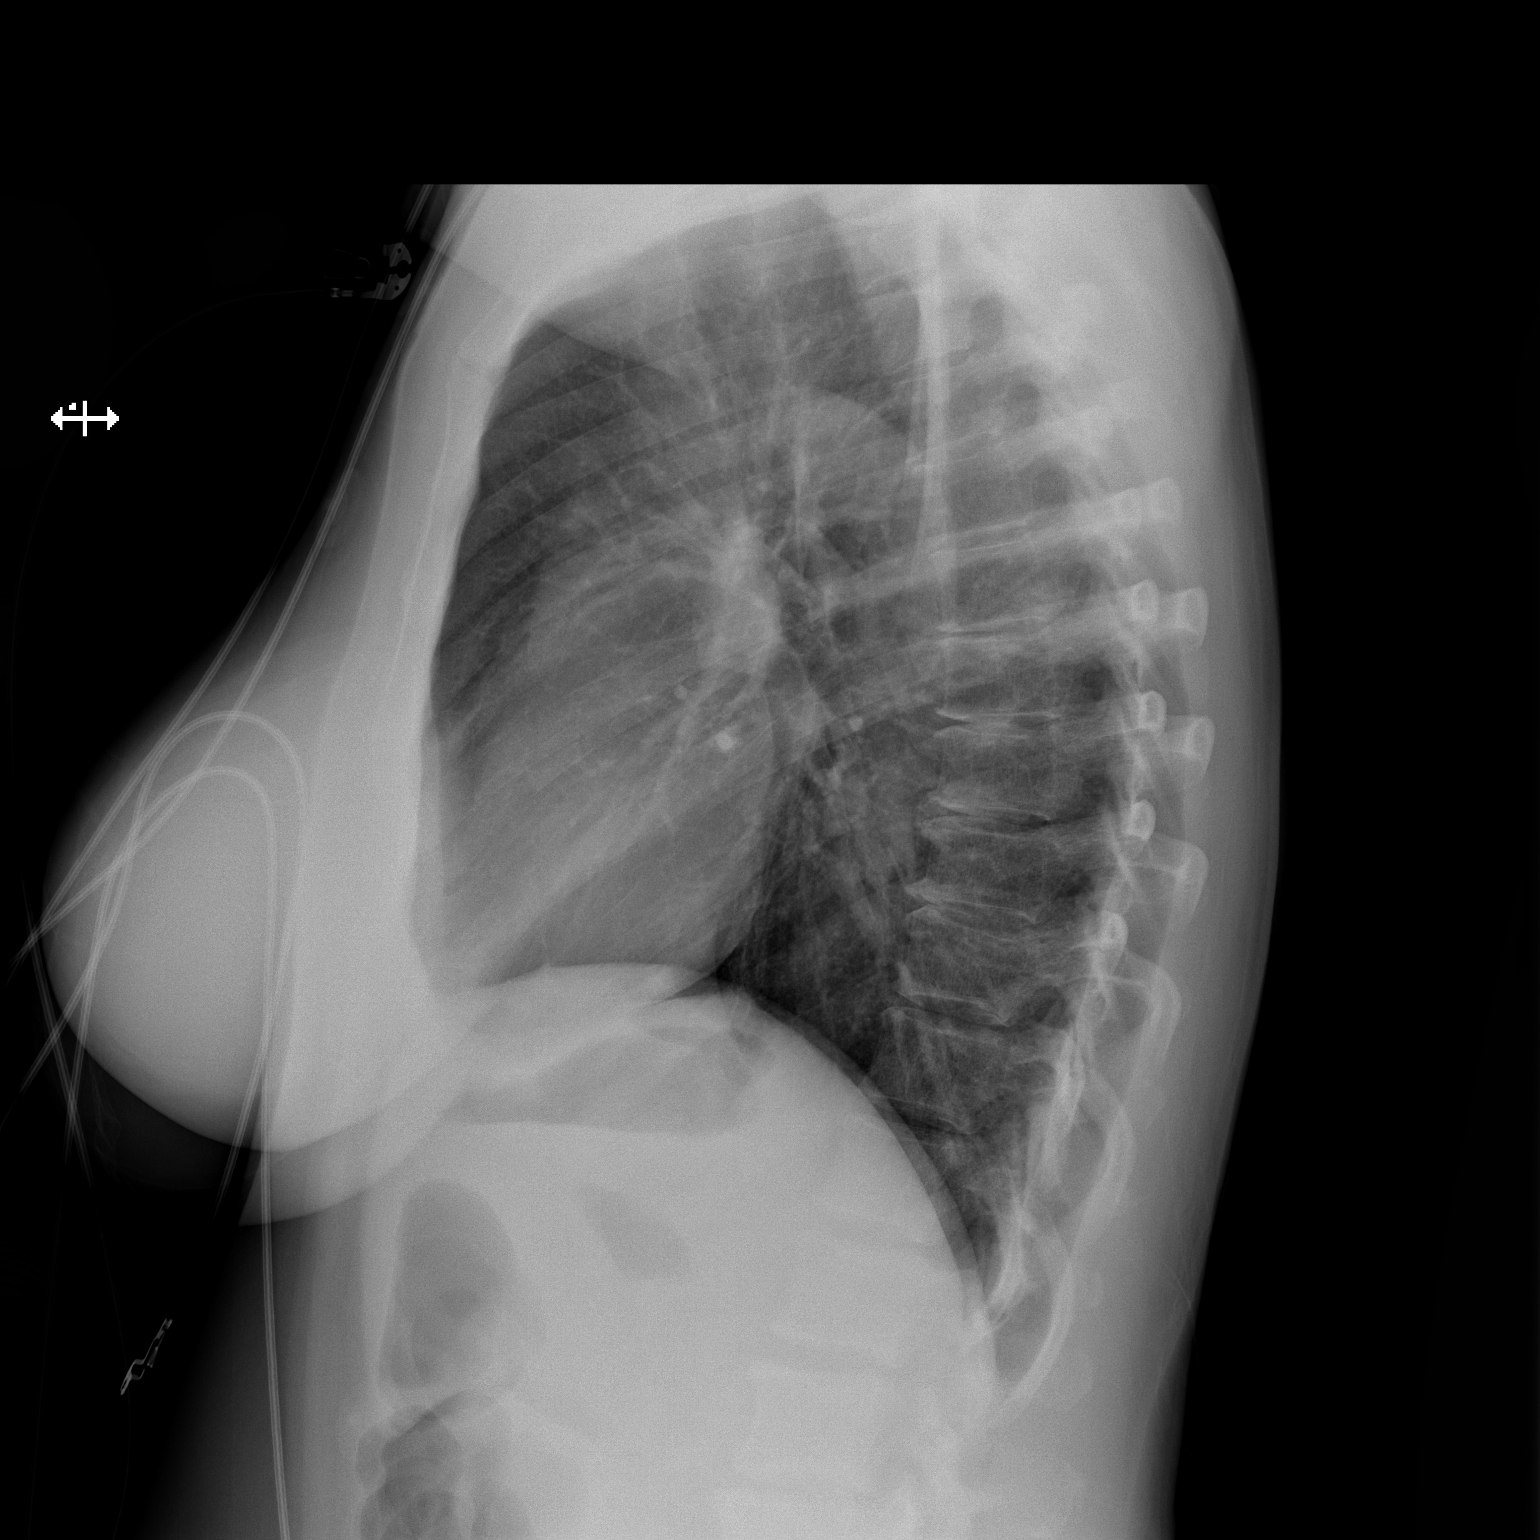

[2 of 2 positions shown; findings below may reference images not displayed]

FINDINGS: The cardiac and mediastinal silhouettes are stable in size and
contour, and remain within normal limits.

The lungs are normally inflated. No airspace consolidation, pleural
effusion, or pulmonary edema is identified. There is no
pneumothorax.

No acute osseous abnormality identified.
IMPRESSION: No active cardiopulmonary disease.

## 2019-10-24 IMAGING — CR LUMBAR SPINE - COMPLETE 4+ VIEW
5 series · 5 of 5 positions shown · non-contrast
Comparison: 04/28/2016

CLINICAL DATA: Low back pain

EXAM:
LUMBAR SPINE - COMPLETE 4+ VIEW

[t lumbar spine ap]
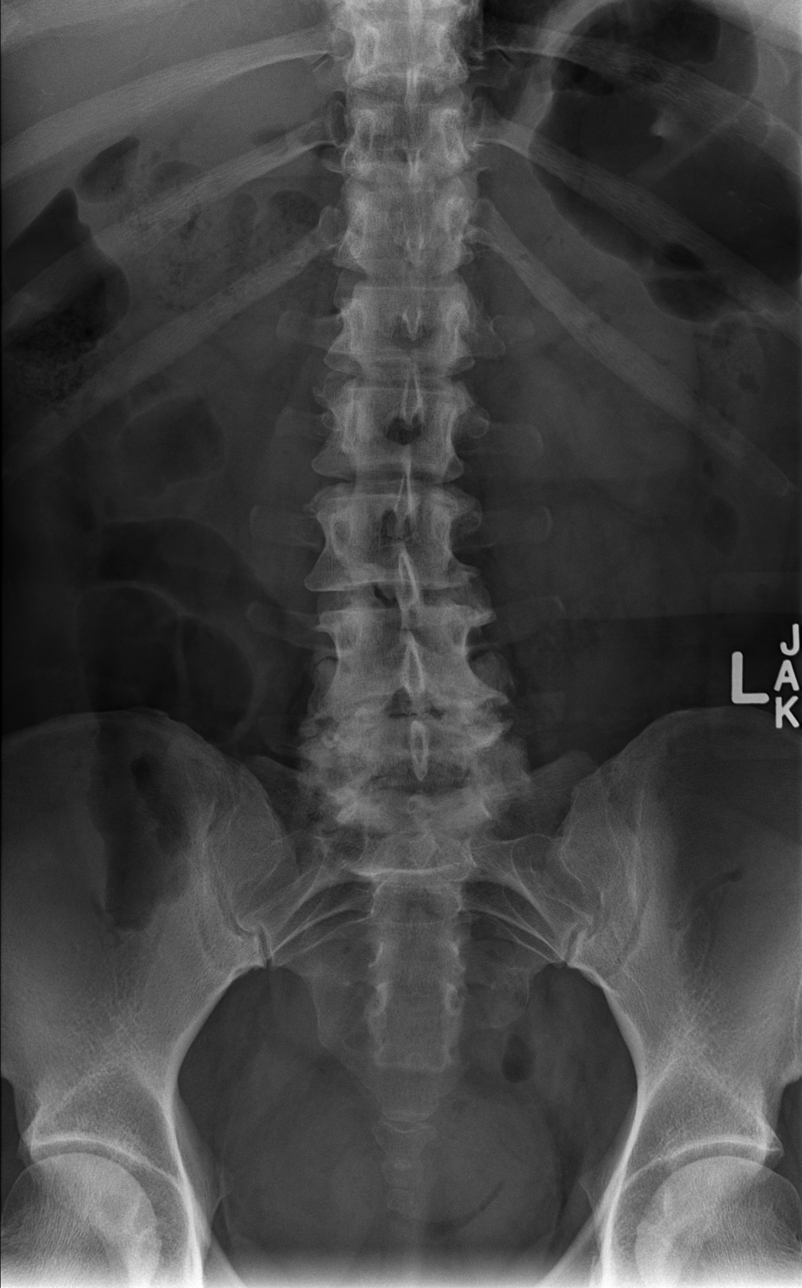

[t lumbar spine obl (1 of 2)]
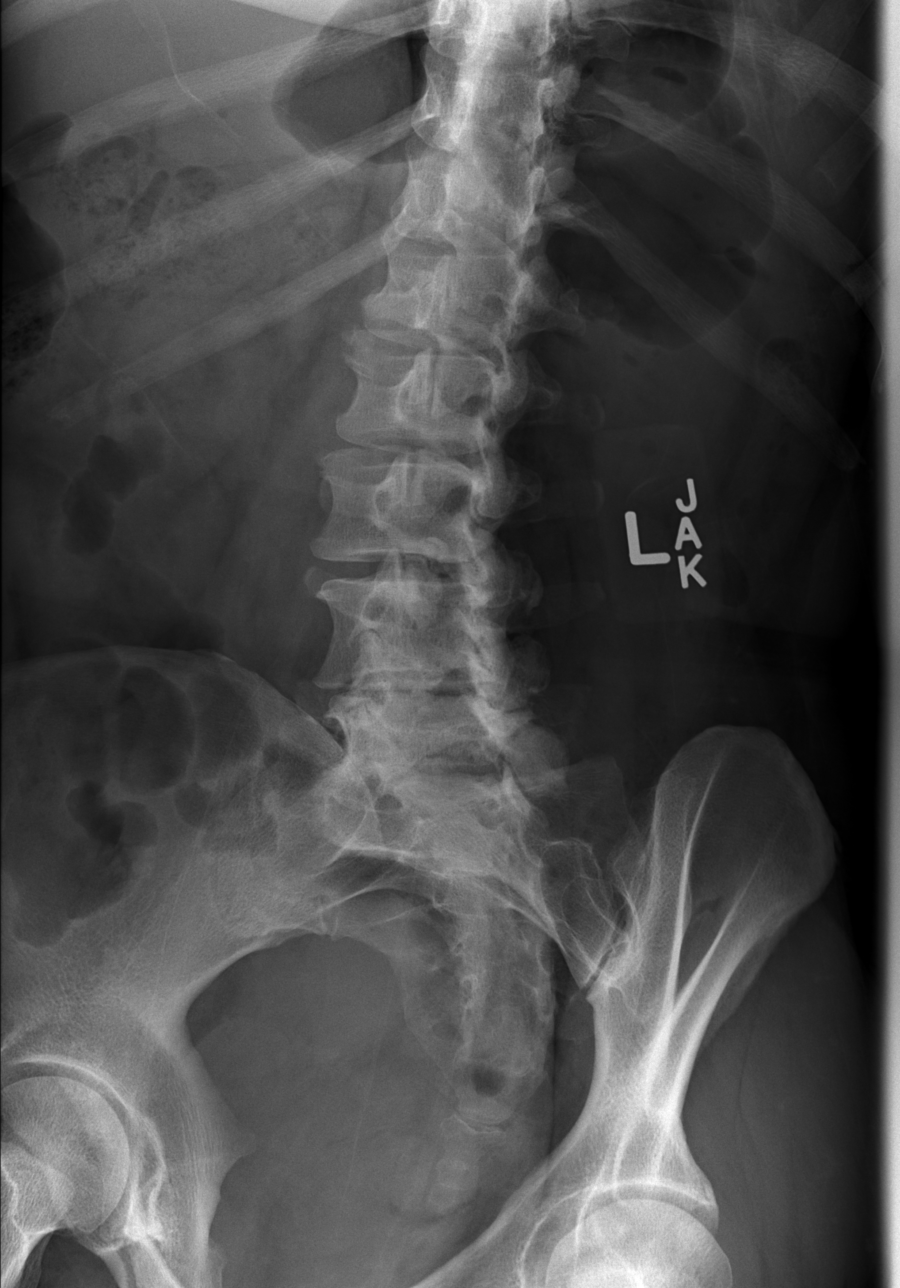

[t lumbar spine obl (2 of 2)]
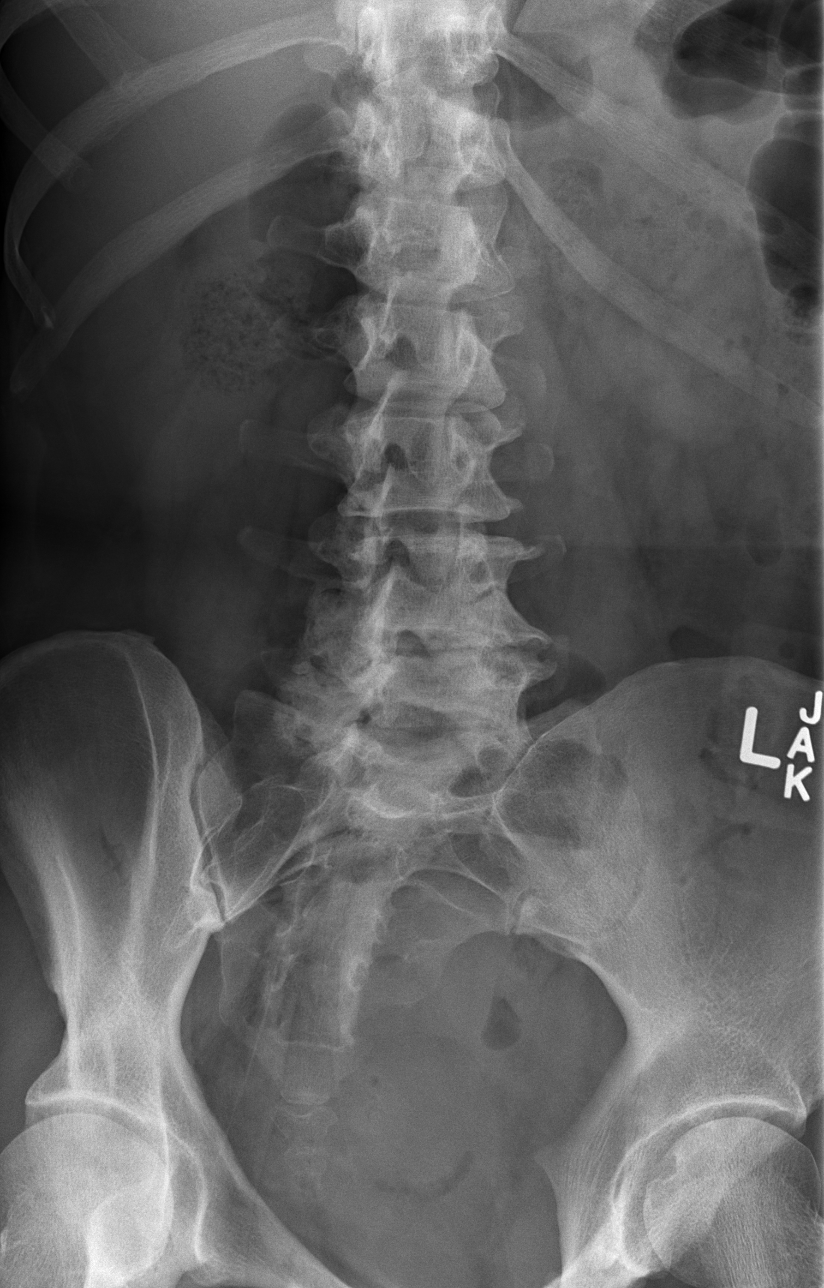

[t lumbar spine lat]
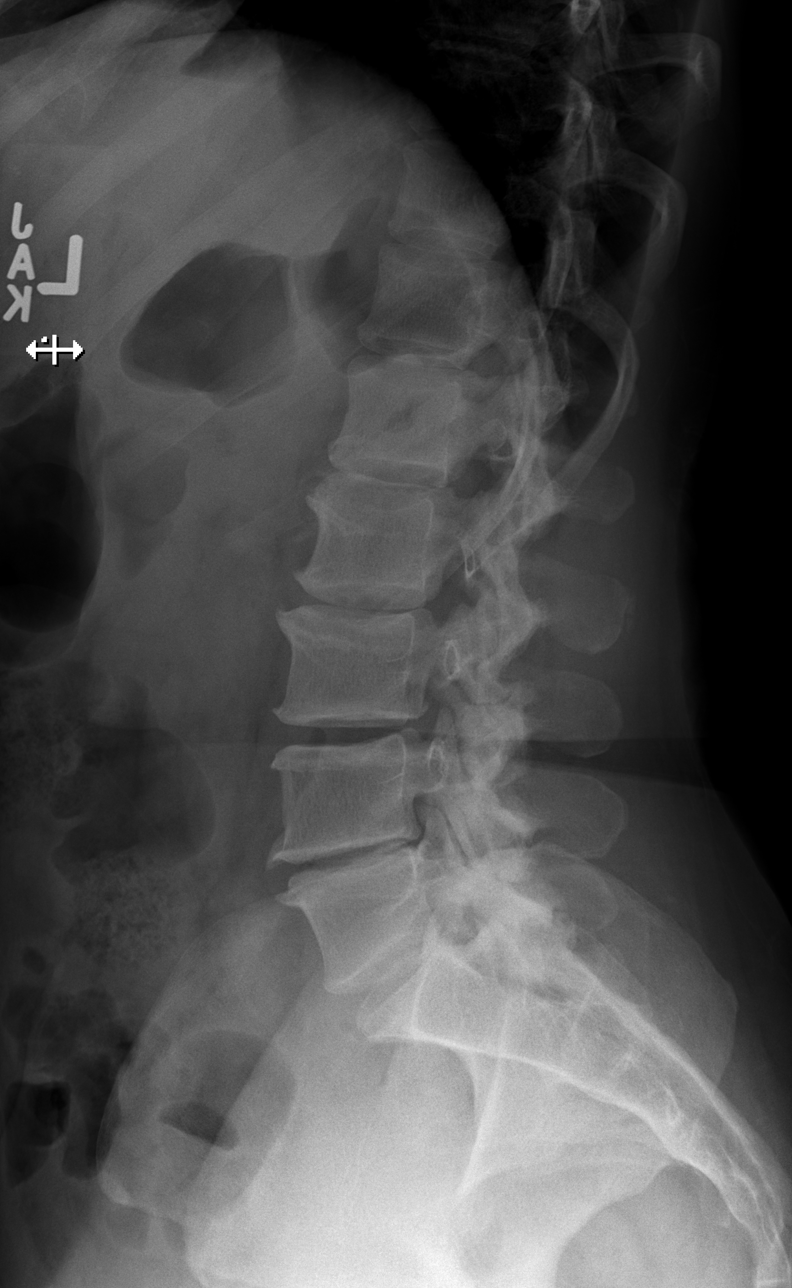

[t lumbar l-5 s-1 spot]
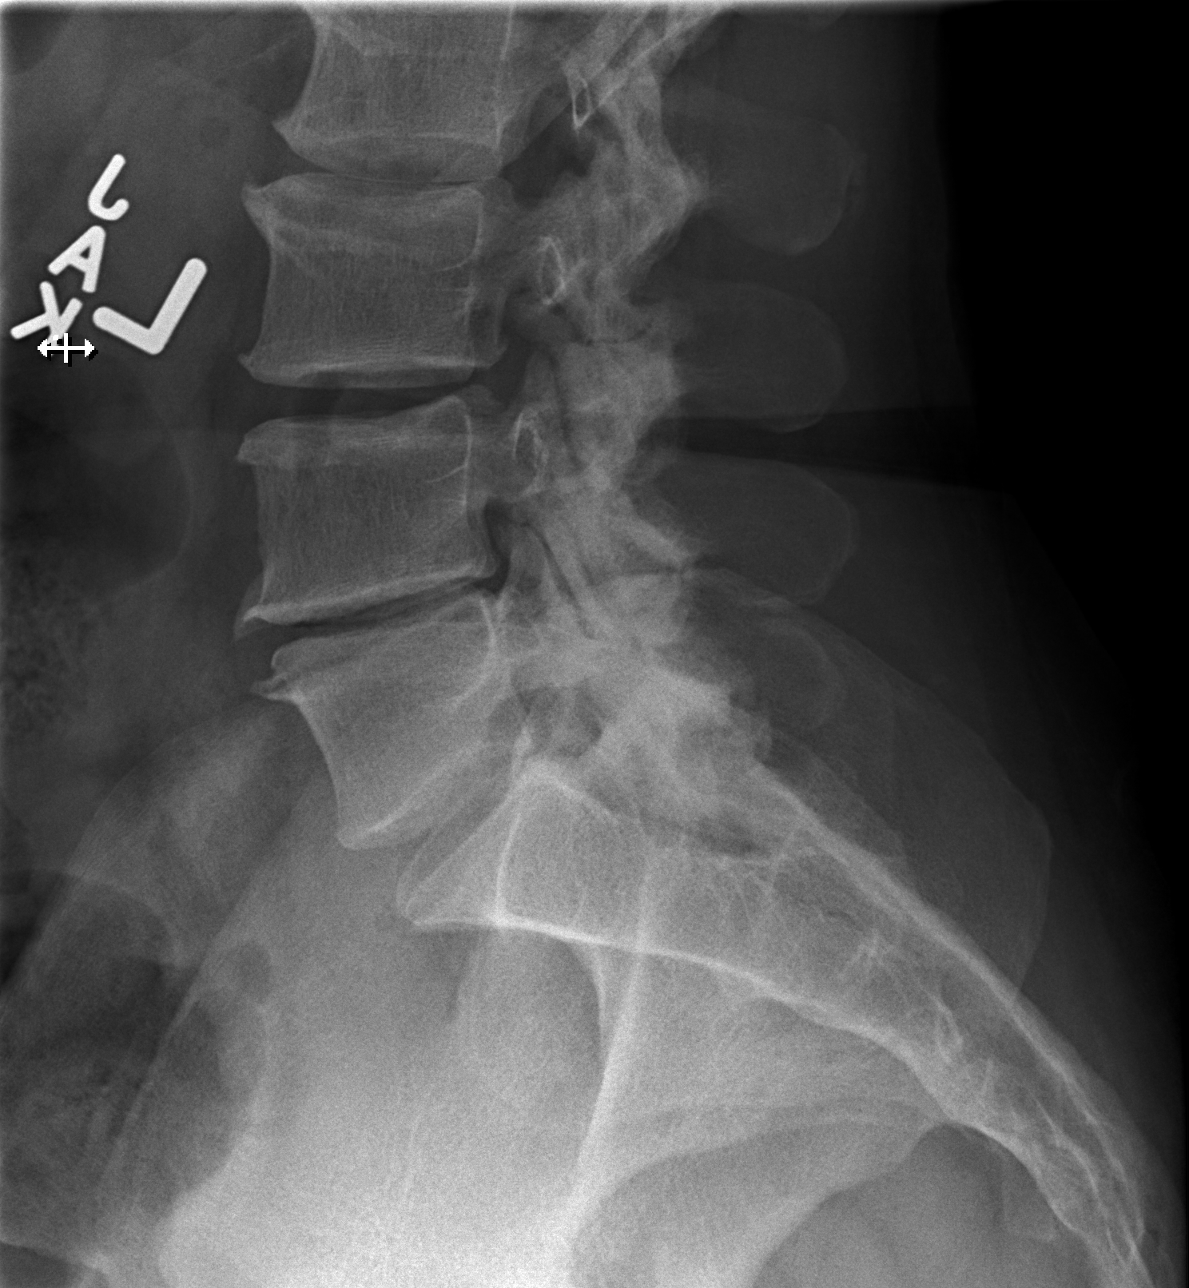

[5 of 5 positions shown; findings below may reference images not displayed]

FINDINGS: Degenerative disc disease and facet disease throughout the lumbar
spine. Disc space narrowing and anterior spurring. SI joints
symmetric and unremarkable. No fracture or subluxation.
IMPRESSION: Degenerative disc and facet disease diffusely. No acute bony
abnormality.
